# Patient Record
Sex: Female | Born: 1950 | Race: White | Hispanic: No | Marital: Married | Smoking: Current every day smoker
Health system: Southern US, Community
[De-identification: ages and names within clinical notes are randomized; demographics above are authoritative.]

## PROBLEM LIST (undated history)

## (undated) DIAGNOSIS — J449 Chronic obstructive pulmonary disease, unspecified: Secondary | ICD-10-CM

## (undated) DIAGNOSIS — Z720 Tobacco use: Secondary | ICD-10-CM

---

## 1999-07-27 ENCOUNTER — Ambulatory Visit (HOSPITAL_COMMUNITY): Admission: RE | Admit: 1999-07-27 | Discharge: 1999-07-27 | Payer: Self-pay | Admitting: Urology

## 2000-10-18 ENCOUNTER — Encounter: Admission: RE | Admit: 2000-10-18 | Discharge: 2000-11-28 | Payer: Self-pay | Admitting: Obstetrics and Gynecology

## 2002-07-09 ENCOUNTER — Encounter: Admission: RE | Admit: 2002-07-09 | Discharge: 2002-09-06 | Payer: Self-pay | Admitting: Unknown Physician Specialty

## 2002-08-06 ENCOUNTER — Encounter: Admission: RE | Admit: 2002-08-06 | Discharge: 2002-09-04 | Payer: Self-pay | Admitting: Unknown Physician Specialty

## 2016-08-29 ENCOUNTER — Encounter: Payer: Self-pay | Admitting: Internal Medicine

## 2016-09-14 ENCOUNTER — Ambulatory Visit: Payer: Self-pay | Admitting: Nurse Practitioner

## 2017-03-17 ENCOUNTER — Other Ambulatory Visit: Payer: Self-pay | Admitting: *Deleted

## 2018-08-25 ENCOUNTER — Encounter: Payer: Self-pay | Admitting: Emergency Medicine

## 2018-08-25 ENCOUNTER — Inpatient Hospital Stay (HOSPITAL_COMMUNITY)
Admission: EM | Admit: 2018-08-25 | Discharge: 2018-08-28 | DRG: 193 | Disposition: A | Discharge status: Home Health | Payer: Medicare Other | Attending: Family Medicine | Admitting: Family Medicine

## 2018-08-25 ENCOUNTER — Emergency Department (HOSPITAL_COMMUNITY): Payer: Medicare Other

## 2018-08-25 ENCOUNTER — Other Ambulatory Visit: Payer: Self-pay

## 2018-08-25 DIAGNOSIS — J101 Influenza due to other identified influenza virus with other respiratory manifestations: Secondary | ICD-10-CM | POA: Diagnosis present

## 2018-08-25 DIAGNOSIS — J189 Pneumonia, unspecified organism: Secondary | ICD-10-CM | POA: Diagnosis not present

## 2018-08-25 DIAGNOSIS — F17210 Nicotine dependence, cigarettes, uncomplicated: Secondary | ICD-10-CM | POA: Diagnosis present

## 2018-08-25 DIAGNOSIS — G9340 Encephalopathy, unspecified: Secondary | ICD-10-CM | POA: Diagnosis present

## 2018-08-25 DIAGNOSIS — G92 Toxic encephalopathy: Secondary | ICD-10-CM | POA: Diagnosis present

## 2018-08-25 DIAGNOSIS — N390 Urinary tract infection, site not specified: Secondary | ICD-10-CM | POA: Diagnosis present

## 2018-08-25 DIAGNOSIS — J449 Chronic obstructive pulmonary disease, unspecified: Secondary | ICD-10-CM | POA: Diagnosis present

## 2018-08-25 DIAGNOSIS — T380X5A Adverse effect of glucocorticoids and synthetic analogues, initial encounter: Secondary | ICD-10-CM | POA: Diagnosis present

## 2018-08-25 DIAGNOSIS — Z7951 Long term (current) use of inhaled steroids: Secondary | ICD-10-CM

## 2018-08-25 DIAGNOSIS — I10 Essential (primary) hypertension: Secondary | ICD-10-CM | POA: Diagnosis present

## 2018-08-25 DIAGNOSIS — F419 Anxiety disorder, unspecified: Secondary | ICD-10-CM | POA: Diagnosis present

## 2018-08-25 DIAGNOSIS — J1000 Influenza due to other identified influenza virus with unspecified type of pneumonia: Principal | ICD-10-CM | POA: Diagnosis present

## 2018-08-25 DIAGNOSIS — T426X5A Adverse effect of other antiepileptic and sedative-hypnotic drugs, initial encounter: Secondary | ICD-10-CM | POA: Diagnosis present

## 2018-08-25 DIAGNOSIS — Z79890 Hormone replacement therapy (postmenopausal): Secondary | ICD-10-CM | POA: Diagnosis not present

## 2018-08-25 DIAGNOSIS — R739 Hyperglycemia, unspecified: Secondary | ICD-10-CM | POA: Diagnosis present

## 2018-08-25 DIAGNOSIS — J9601 Acute respiratory failure with hypoxia: Secondary | ICD-10-CM | POA: Diagnosis present

## 2018-08-25 DIAGNOSIS — J111 Influenza due to unidentified influenza virus with other respiratory manifestations: Secondary | ICD-10-CM

## 2018-08-25 DIAGNOSIS — Z720 Tobacco use: Secondary | ICD-10-CM | POA: Diagnosis present

## 2018-08-25 DIAGNOSIS — J441 Chronic obstructive pulmonary disease with (acute) exacerbation: Secondary | ICD-10-CM | POA: Diagnosis present

## 2018-08-25 DIAGNOSIS — J440 Chronic obstructive pulmonary disease with acute lower respiratory infection: Secondary | ICD-10-CM | POA: Diagnosis present

## 2018-08-25 DIAGNOSIS — E039 Hypothyroidism, unspecified: Secondary | ICD-10-CM | POA: Diagnosis present

## 2018-08-25 DIAGNOSIS — J1081 Influenza due to other identified influenza virus with encephalopathy: Secondary | ICD-10-CM | POA: Diagnosis present

## 2018-08-25 DIAGNOSIS — A419 Sepsis, unspecified organism: Secondary | ICD-10-CM

## 2018-08-25 DIAGNOSIS — R6520 Severe sepsis without septic shock: Secondary | ICD-10-CM

## 2018-08-25 HISTORY — DX: Tobacco use: Z72.0

## 2018-08-25 HISTORY — DX: Chronic obstructive pulmonary disease, unspecified: J44.9

## 2018-08-25 LAB — CBC WITH DIFFERENTIAL/PLATELET
Abs Immature Granulocytes: 0.13 10*3/uL — ABNORMAL HIGH (ref 0.00–0.07)
Basophils Absolute: 0 10*3/uL (ref 0.0–0.1)
Basophils Relative: 0 %
Eosinophils Absolute: 0.1 10*3/uL (ref 0.0–0.5)
Eosinophils Relative: 1 %
HCT: 38.4 % (ref 36.0–46.0)
Hemoglobin: 12 g/dL (ref 12.0–15.0)
Immature Granulocytes: 2 %
Lymphocytes Relative: 10 %
Lymphs Abs: 0.8 10*3/uL (ref 0.7–4.0)
MCH: 33.5 pg (ref 26.0–34.0)
MCHC: 31.3 g/dL (ref 30.0–36.0)
MCV: 107.3 fL — ABNORMAL HIGH (ref 80.0–100.0)
Monocytes Absolute: 0.6 10*3/uL (ref 0.1–1.0)
Monocytes Relative: 7 %
Neutro Abs: 6.6 10*3/uL (ref 1.7–7.7)
Neutrophils Relative %: 80 %
Platelets: 210 10*3/uL (ref 150–400)
RBC: 3.58 MIL/uL — ABNORMAL LOW (ref 3.87–5.11)
RDW: 16.1 % — ABNORMAL HIGH (ref 11.5–15.5)
WBC: 8.2 10*3/uL (ref 4.0–10.5)
nRBC: 0 % (ref 0.0–0.2)

## 2018-08-25 LAB — URINALYSIS, ROUTINE W REFLEX MICROSCOPIC
Bilirubin Urine: NEGATIVE
Glucose, UA: NEGATIVE mg/dL
Ketones, ur: NEGATIVE mg/dL
Leukocytes,Ua: NEGATIVE
Nitrite: POSITIVE — AB
Protein, ur: 100 mg/dL — AB
Specific Gravity, Urine: 1.019 (ref 1.005–1.030)
pH: 5 (ref 5.0–8.0)

## 2018-08-25 LAB — COMPREHENSIVE METABOLIC PANEL
ALT: 147 U/L — ABNORMAL HIGH (ref 0–44)
AST: 65 U/L — ABNORMAL HIGH (ref 15–41)
Albumin: 4.1 g/dL (ref 3.5–5.0)
Alkaline Phosphatase: 109 U/L (ref 38–126)
Anion gap: 11 (ref 5–15)
BUN: 8 mg/dL (ref 8–23)
CO2: 27 mmol/L (ref 22–32)
Calcium: 8.6 mg/dL — ABNORMAL LOW (ref 8.9–10.3)
Chloride: 100 mmol/L (ref 98–111)
Creatinine, Ser: 0.72 mg/dL (ref 0.44–1.00)
GFR calc Af Amer: >60 mL/min (ref >60)
GFR calc non Af Amer: >60 mL/min (ref >60)
Glucose, Bld: 148 mg/dL — ABNORMAL HIGH (ref 70–99)
Potassium: 3.5 mmol/L (ref 3.5–5.1)
Sodium: 138 mmol/L (ref 135–145)
Total Bilirubin: 0.4 mg/dL (ref 0.3–1.2)
Total Protein: 8 g/dL (ref 6.5–8.1)

## 2018-08-25 LAB — INFLUENZA PANEL BY PCR (TYPE A & B)
Influenza A By PCR: POSITIVE — AB
Influenza B By PCR: NEGATIVE

## 2018-08-25 LAB — LACTIC ACID, PLASMA
Lactic Acid, Venous: 2.1 mmol/L (ref 0.5–1.9)
Lactic Acid, Venous: 1.9 mmol/L (ref 0.5–1.9)

## 2018-08-25 MED ORDER — LEVOTHYROXINE SODIUM 88 MCG PO TABS
88.0000 ug | ORAL_TABLET | Freq: Every day | ORAL | Status: DC
  Administered 2018-08-26 – 2018-08-28 (×3): 88 ug via ORAL
  Filled 2018-08-25 (×6): qty 1

## 2018-08-25 MED ORDER — VANCOMYCIN HCL IN DEXTROSE 1-5 GM/200ML-% IV SOLN
1000.0000 mg | Freq: Once | INTRAVENOUS | Status: AC
  Administered 2018-08-25: 1000 mg via INTRAVENOUS
  Filled 2018-08-25: qty 200

## 2018-08-25 MED ORDER — SODIUM CHLORIDE 0.9 % IV SOLN
2.0000 g | Freq: Once | INTRAVENOUS | Status: AC
  Administered 2018-08-25: 2 g via INTRAVENOUS
  Filled 2018-08-25: qty 2

## 2018-08-25 MED ORDER — OSELTAMIVIR PHOSPHATE 30 MG PO CAPS
30.0000 mg | ORAL_CAPSULE | Freq: Two times a day (BID) | ORAL | Status: DC
  Administered 2018-08-25 – 2018-08-28 (×6): 30 mg via ORAL
  Filled 2018-08-25 (×6): qty 1

## 2018-08-25 MED ORDER — IPRATROPIUM-ALBUTEROL 0.5-2.5 (3) MG/3ML IN SOLN
3.0000 mL | Freq: Once | RESPIRATORY_TRACT | Status: AC
  Administered 2018-08-25: 3 mL via RESPIRATORY_TRACT
  Filled 2018-08-25: qty 3

## 2018-08-25 MED ORDER — SODIUM CHLORIDE 0.9 % IV SOLN
500.0000 mg | INTRAVENOUS | Status: DC
  Administered 2018-08-25 – 2018-08-27 (×3): 500 mg via INTRAVENOUS
  Filled 2018-08-25 (×3): qty 500

## 2018-08-25 MED ORDER — OSELTAMIVIR PHOSPHATE 75 MG PO CAPS
75.0000 mg | ORAL_CAPSULE | Freq: Once | ORAL | Status: AC
  Administered 2018-08-25: 75 mg via ORAL
  Filled 2018-08-25: qty 1

## 2018-08-25 MED ORDER — TRAMADOL HCL 50 MG PO TABS
50.0000 mg | ORAL_TABLET | Freq: Two times a day (BID) | ORAL | Status: DC | PRN
  Administered 2018-08-26 – 2018-08-27 (×3): 50 mg via ORAL
  Filled 2018-08-25 (×3): qty 1

## 2018-08-25 MED ORDER — BUDESONIDE 0.25 MG/2ML IN SUSP
0.2500 mg | Freq: Two times a day (BID) | RESPIRATORY_TRACT | Status: DC
  Administered 2018-08-25 – 2018-08-28 (×6): 0.25 mg via RESPIRATORY_TRACT
  Filled 2018-08-25 (×6): qty 2

## 2018-08-25 MED ORDER — ACETAMINOPHEN 325 MG PO TABS
650.0000 mg | ORAL_TABLET | Freq: Once | ORAL | Status: AC
Start: 2018-08-25 — End: 2018-08-25
  Administered 2018-08-25: 650 mg via ORAL
  Filled 2018-08-25: qty 2

## 2018-08-25 MED ORDER — SODIUM CHLORIDE 0.9 % IV SOLN
INTRAVENOUS | Status: DC
  Administered 2018-08-25 – 2018-08-27 (×3): via INTRAVENOUS

## 2018-08-25 MED ORDER — GUAIFENESIN ER 600 MG PO TB12
1200.0000 mg | ORAL_TABLET | Freq: Two times a day (BID) | ORAL | Status: DC
  Administered 2018-08-25 – 2018-08-28 (×7): 1200 mg via ORAL
  Filled 2018-08-25 (×7): qty 2

## 2018-08-25 MED ORDER — SODIUM CHLORIDE 0.9 % IV SOLN
1.0000 g | INTRAVENOUS | Status: DC
  Administered 2018-08-25 – 2018-08-27 (×3): 1 g via INTRAVENOUS
  Filled 2018-08-25 (×3): qty 10

## 2018-08-25 MED ORDER — SODIUM CHLORIDE 0.9 % IV BOLUS
1000.0000 mL | Freq: Once | INTRAVENOUS | Status: AC
  Administered 2018-08-25: 1000 mL via INTRAVENOUS

## 2018-08-25 MED ORDER — BUSPIRONE HCL 5 MG PO TABS
15.0000 mg | ORAL_TABLET | Freq: Three times a day (TID) | ORAL | Status: DC
  Administered 2018-08-25 – 2018-08-28 (×9): 15 mg via ORAL
  Filled 2018-08-25 (×9): qty 3

## 2018-08-25 MED ORDER — ENOXAPARIN SODIUM 40 MG/0.4ML ~~LOC~~ SOLN
40.0000 mg | SUBCUTANEOUS | Status: DC
  Administered 2018-08-25 – 2018-08-27 (×3): 40 mg via SUBCUTANEOUS
  Filled 2018-08-25 (×3): qty 0.4

## 2018-08-25 MED ORDER — IPRATROPIUM-ALBUTEROL 0.5-2.5 (3) MG/3ML IN SOLN
3.0000 mL | Freq: Four times a day (QID) | RESPIRATORY_TRACT | Status: DC
  Administered 2018-08-25 – 2018-08-26 (×3): 3 mL via RESPIRATORY_TRACT
  Filled 2018-08-25 (×4): qty 3

## 2018-08-25 MED ORDER — GABAPENTIN 300 MG PO CAPS
600.0000 mg | ORAL_CAPSULE | Freq: Three times a day (TID) | ORAL | Status: DC
  Administered 2018-08-25 – 2018-08-26 (×3): 600 mg via ORAL
  Filled 2018-08-25 (×3): qty 2

## 2018-08-25 MED ORDER — ALBUTEROL SULFATE (2.5 MG/3ML) 0.083% IN NEBU: 2.5000 mg | INHALATION_SOLUTION | Freq: Four times a day (QID) | RESPIRATORY_TRACT | Status: DC | PRN

## 2018-08-25 MED ORDER — OSELTAMIVIR PHOSPHATE 75 MG PO CAPS: 75.0000 mg | ORAL_CAPSULE | Freq: Two times a day (BID) | ORAL | Status: DC

## 2018-08-25 NOTE — ED Provider Notes (Signed)
[facility] EMERGENCY DEPARTMENT Provider Note   CSN: [id number] Arrival date & time: 08/25/18  0272    History   Chief Complaint Chief Complaint  Patient presents with  . Fever    HPI Melissa Odom is a 68 y.o. female.     HPI   67yF with fever and confusion. History primarily from husband. Onset of symptoms about a week ago. Coughing a lot. Tired and sleeping a lot. Confused at times. Poor appetite. He has been trying to get her to see the doctor for several days until she finally agreed to this morning. Last night she was incontinent of urine. She is a smoker but husband isn't aware of formal diagnosis of underlying lung disease. She is not on supplemental oxygen at baseline.   History reviewed. No pertinent past medical history.  There are no active problems to display for this patient.   History reviewed. No pertinent surgical history.   OB History   No obstetric history on file.      Home Medications    Prior to Admission medications   Not on File    Family History History reviewed. No pertinent family history.  Social History Social History   Tobacco Use  . Smoking status: Current Every Day Smoker    Packs/day: 1.00    Types: Cigarettes  . Smokeless tobacco: Never Used  Substance Use Topics  . Alcohol use: Never    Frequency: Never  . Drug use: Never    Comment: previous opiate addiction     Allergies   Patient has no known allergies.   Review of Systems Review of Systems Level 5 caveat because of confusion.   Physical Exam Updated Vital Signs BP 116/66   Pulse (!) 110   Temp (!) 103.1 F (39.5 C) (Oral)   Resp (!) 33   Ht  (1.499 m)   Wt 52.2 kg   SpO2 95%   BMI 23.23 kg/m   Physical Exam Vitals signs and nursing note reviewed.  Constitutional:      Appearance: She is well-developed. She is ill-appearing.     Comments: Drowsy. Opens eyes to voice. Answering questions but clearly confused. Disoriented to place and  time. Follows simple commands. No focal motor deficits.   HENT:     Head: Normocephalic and atraumatic.  Eyes:     General:        Right eye: No discharge.        Left eye: No discharge.     Conjunctiva/sclera: Conjunctivae normal.  Neck:     Musculoskeletal: Neck supple.  Cardiovascular:     Rate and Rhythm: Normal rate and regular rhythm.     Heart sounds: Normal heart sounds. No murmur. No friction rub. No gallop.   Pulmonary:     Effort: Pulmonary effort is normal. No respiratory distress.     Breath sounds: Normal breath sounds.  Abdominal:     General: There is no distension.     Palpations: Abdomen is soft.     Tenderness: There is no abdominal tenderness.  Musculoskeletal:        General: No tenderness.  Skin:    General: Skin is warm and dry.  Psychiatric:        Behavior: Behavior normal.        Thought Content: Thought content normal.      ED Treatments / Results  Labs (all labs ordered are listed, but only abnormal results are displayed) Labs Reviewed  CBC  WITH DIFFERENTIAL/PLATELET - Abnormal; Notable for the following components:      Result Value   RBC 3.58 (*)    MCV 107.3 (*)    RDW 16.1 (*)    Abs Immature Granulocytes 0.13 (*)    All other components within normal limits  COMPREHENSIVE METABOLIC PANEL - Abnormal; Notable for the following components:   Glucose, Bld 148 (*)    Calcium 8.6 (*)    AST 65 (*)    ALT 147 (*)    All other components within normal limits  INFLUENZA PANEL BY PCR (TYPE A & B) - Abnormal; Notable for the following components:   Influenza A By PCR POSITIVE (*)    All other components within normal limits  URINALYSIS, ROUTINE W REFLEX MICROSCOPIC - Abnormal; Notable for the following components:   Color, Urine AMBER (*)    APPearance HAZY (*)    Hgb urine dipstick SMALL (*)    Protein, ur 100 (*)    Nitrite POSITIVE (*)    Bacteria, UA MANY (*)    All other components within normal limits  LACTIC ACID, PLASMA -  Abnormal; Notable for the following components:   Lactic Acid, Venous 2.1 (*)    All other components within normal limits  CULTURE, BLOOD (ROUTINE X 2)  CULTURE, BLOOD (ROUTINE X 2)  LACTIC ACID, PLASMA    EKG None  Radiology Dg Chest 2 View  Result Date: 08/25/2018 CLINICAL DATA:  Fever and cough EXAM: CHEST - 2 VIEW COMPARISON:  None. FINDINGS: Cardiac shadows within normal limits. Aortic calcifications are again noted. Lungs are well aerated bilaterally with patchy bronchitic changes. More focal infiltrate is noted in the left upper lobe. No sizable effusion is noted. IMPRESSION: Increased bronchitic changes with more focal infiltrate in the left apex. Followup PA and lateral chest X-ray is recommended in 3-4 weeks following trial of antibiotic therapy to ensure resolution and exclude underlying malignancy. Electronically Signed   By: Alcide Clever M.D.   On: 08/25/2018 09:07    Procedures Procedures (including critical care time)  CRITICAL CARE Performed by: Raeford Razor Total critical care time: 35 minutes Critical care time was exclusive of separately billable procedures and treating other patients. Critical care was necessary to treat or prevent imminent or life-threatening deterioration. Critical care was time spent personally by me on the following activities: development of treatment plan with patient and/or surrogate as well as nursing, discussions with consultants, evaluation of patient's response to treatment, examination of patient, obtaining history from patient or surrogate, ordering and performing treatments and interventions, ordering and review of laboratory studies, ordering and review of radiographic studies, pulse oximetry and re-evaluation of patient's condition.   Medications Ordered in ED Medications  vancomycin (VANCOCIN) IVPB 1000 mg/200 mL premix (has no administration in time range)  oseltamivir (TAMIFLU) capsule 75 mg (has no administration in time range)   sodium chloride 0.9 % bolus 1,000 mL (has no administration in time range)  ceFEPIme (MAXIPIME) 2 g in sodium chloride 0.9 % 100 mL IVPB (2 g Intravenous New Bag/Given 08/25/18 0824)  acetaminophen (TYLENOL) tablet 650 mg (650 mg Oral Given 08/25/18 0817)  ipratropium-albuterol (DUONEB) 0.5-2.5 (3) MG/3ML nebulizer solution 3 mL (3 mLs Nebulization Given 08/25/18 0759)     Initial Impression / Assessment and Plan / ED Course  I have reviewed the triage vital signs and the nursing notes.  Pertinent labs & imaging results that were available during my care of the patient were reviewed by me  and considered in my medical decision making (see chart for details).  36yf with fever and confusion. Positive for influenza A, also possible pneumonia on chest x-ray as well as UTI.   Received empiric cefepime/vanc prior to w/u coming back. Tamiflu added. Hypoxic on Ra to 70s but in 90s on 3L via [place]. Encephalopathy likely from infectious processes. Exam not concerning for meningitis. Listed hx of opiate addiction. She is prescribed tramadol and clonazepam. Husband has no recent concerns for misuse.   Final Clinical Impressions(s) / ED Diagnoses   Final diagnoses:  Acute respiratory failure with hypoxia (HCC)  Influenza with respiratory manifestation  Urinary tract infection without hematuria, site unspecified  Sepsis with acute hypoxic respiratory failure, due to unspecified organism, unspecified whether septic shock present [facility])    ED Discharge Orders    None       Raeford Razor, MD 08/29/18 1229

## 2018-08-25 NOTE — ED Notes (Signed)
Date and time results received: 08/25/18 0859 (use smartphrase ".now" to insert current time)  Test: Lactic Acid Critical Value: 2.1  Name of Provider Notified: Dr. Juleen China  Orders Received? Or Actions Taken?: RN & MD made aware.

## 2018-08-25 NOTE — ED Triage Notes (Signed)
Pt brought in by her husband for evaluation of cough, congestion, and altered mental status all week.  Pt does not wear home oxygen.  Is disoriented to place and time.

## 2018-08-25 NOTE — H&P (Signed)
History and Physical    Melissa Odom LMB:[id number] DOB: 1951-06-15 DOA: 08/25/2018  PCP: Kirstie Peri, MD  Patient coming from: Home  I have personally briefly reviewed patient's old medical records in [facility]  Chief Complaint: Cough  HPI: Melissa Odom is a 68 y.o. female with medical history significant of anxiety, tobacco use, hypertension, hypothyroidism, is brought to the hospital with cough.  History is limited since patient is somnolent.  No family is available for additional history.  Per the medical record, patient has been increasingly confused.  She has been febrile.  She has been coughing for the past week.  She has been sleeping more.  She is very poor appetite.  She has been increasingly weak.  ED Course: On arrival to the emergency room, she was noted to be hypoxic.  She also febrile with a temperature of 103.  She is tachycardic.  Blood pressure stable.  She is given Tylenol which improved her fever.  Influenza PCR is positive.  Chest x-ray indicates developing pneumonia.  Urinalysis indicates possible infection.  She was started on intravenous antibiotics.  She is receiving IV fluids.  She has been referred for admission.  Review of Systems: Limited due to patient's mental status Constitutional: Positive for fever and malaise and generalized weakness Respiratory: Positive for cough Neuro: Positive for lethargy and confusion, negative for unilateral weakness Skin: Negative for rash All other systems reviewed and are negative   History reviewed. No pertinent past medical history.  History reviewed. No pertinent surgical history.  Social History:  reports that she has been smoking cigarettes. She has been smoking about 1.00 pack per day. She has never used smokeless tobacco. She reports that she does not drink alcohol or use drugs.  Allergies  Allergen Reactions  . Morphine Anaphylaxis  . Sulfamethoxazole Rash    Unknown    Family history: Unable to  assess due to mental status  Prior to Admission medications   Medication Sig Start Date End Date Taking? Authorizing Provider  ALPRAZolam Prudy Feeler) 0.5 MG tablet Take 0.5 mg by mouth 3 (three) times daily as needed.    Yes [provider]  atenolol (TENORMIN) 50 MG tablet Take 50 mg by mouth daily.    Yes [provider]  BREO ELLIPTA 100-25 MCG/INH AEPB Inhale 1 puff into the lungs daily.  04/20/18  Yes [provider]  busPIRone (BUSPAR) 15 MG tablet Take 15 mg by mouth 3 (three) times daily. 08/02/18  Yes [provider]  clonazePAM (KLONOPIN) 1 MG tablet Take 1 mg by mouth 3 (three) times daily as needed for anxiety.    Yes [provider]  gabapentin (NEURONTIN) 600 MG tablet Take 600 mg by mouth 3 (three) times daily.  08/02/18  Yes [provider]  levothyroxine (SYNTHROID, LEVOTHROID) 88 MCG tablet Take 88 mcg by mouth daily. 08/02/18   [provider]  traMADol (ULTRAM) 50 MG tablet Take 50 mg by mouth 2 (two) times daily. 08/13/18   [provider]  Vitamin D, Ergocalciferol, (DRISDOL) 1.25 MG (50000 UT) CAPS capsule Take 50,000 Units by mouth.  08/02/18   [provider]    Physical Exam: Vitals:   08/25/18 0930 08/25/18 0949 08/25/18 1000 08/25/18 1030  BP: 108/64  101/63 101/63  Pulse:   99 93  Resp: (!) 30  (!) 29 (!) 23  Temp:  99.7 F (37.6 C)    TempSrc:  Oral    SpO2:   97% 93%  Weight:      Height:        Constitutional: NAD, calm, comfortable Eyes: PERRL, lids and conjunctivae normal ENMT: Mucous membranes are dry. Posterior pharynx clear of any exudate or lesions.Normal dentition.  Neck: normal, supple, no masses, no thyromegaly Respiratory: Bilateral rhonchi with wheezing. Normal respiratory effort. No accessory muscle use.  Cardiovascular: Regular rate and rhythm, no murmurs / rubs / gallops. No extremity edema. 2+ pedal pulses. No carotid bruits.  Abdomen: no tenderness, no masses  palpated. No hepatosplenomegaly. Bowel sounds positive.  Musculoskeletal: no clubbing / cyanosis. No joint deformity upper and lower extremities. Good ROM, no contractures. Normal muscle tone.  Skin: no rashes, lesions, ulcers. No induration Neurologic: CN 2-12 grossly intact. Sensation intact, DTR normal. Strength 5/5 in all 4.  Psychiatric: Somnolent, wakes up to voice, knows she is in the hospital, pleasant   Labs on Admission: I have personally reviewed following labs and imaging studies  CBC: Recent Labs  Lab 08/25/18 0822  WBC 8.2  NEUTROABS 6.6  HGB 12.0  HCT 38.4  MCV 107.3*  PLT 210   Basic Metabolic Panel: Recent Labs  Lab 08/25/18 0822  NA 138  K 3.5  CL 100  CO2 27  GLUCOSE 148*  BUN 8  CREATININE 0.72  CALCIUM 8.6*   GFR: Estimated Creatinine Clearance: 50.4 mL/min (by C-G formula based on SCr of 0.72 mg/dL). Liver Function Tests: Recent Labs  Lab 08/25/18 0822  AST 65*  ALT 147*  ALKPHOS 109  BILITOT 0.4  PROT 8.0  ALBUMIN 4.1   No results for input(s): LIPASE, AMYLASE in the last 168 hours. No results for input(s): AMMONIA in the last 168 hours. Coagulation Profile: No results for input(s): INR, PROTIME in the last 168 hours. Cardiac Enzymes: No results for input(s): CKTOTAL, CKMB, CKMBINDEX, TROPONINI in the last 168 hours. BNP (last 3 results) No results for input(s): PROBNP in the last 8760 hours. HbA1C: No results for input(s): HGBA1C in the last 72 hours. CBG: No results for input(s): GLUCAP in the last 168 hours. Lipid Profile: No results for input(s): CHOL, HDL, LDLCALC, TRIG, CHOLHDL, LDLDIRECT in the last 72 hours. Thyroid Function Tests: No results for input(s): TSH, T4TOTAL, FREET4, T3FREE, THYROIDAB in the last 72 hours. Anemia Panel: No results for input(s): VITAMINB12, FOLATE, FERRITIN, TIBC, IRON, RETICCTPCT in the last 72 hours. Urine analysis:    Component Value Date/Time   COLORURINE AMBER (A) 08/25/2018 0749    APPEARANCEUR HAZY (A) 08/25/2018 0749   LABSPEC 1.019 08/25/2018 0749   PHURINE 5.0 08/25/2018 0749   GLUCOSEU NEGATIVE 08/25/2018 0749   HGBUR SMALL (A) 08/25/2018 0749   BILIRUBINUR NEGATIVE 08/25/2018 0749   KETONESUR NEGATIVE 08/25/2018 0749   PROTEINUR 100 (A) 08/25/2018 0749   NITRITE POSITIVE (A) 08/25/2018 0749   LEUKOCYTESUR NEGATIVE 08/25/2018 0749    Radiological Exams on Admission: Dg Chest 2 View  Result Date: 08/25/2018 CLINICAL DATA:  Fever and cough EXAM: CHEST - 2 VIEW COMPARISON:  None. FINDINGS: Cardiac shadows within normal limits. Aortic calcifications are again noted. Lungs are well aerated bilaterally with patchy bronchitic changes. More focal infiltrate is noted in the left upper lobe. No sizable effusion is noted. IMPRESSION: Increased bronchitic changes with more focal infiltrate in the left apex. Followup PA and lateral chest X-ray is recommended in 3-4 weeks following trial of antibiotic therapy to ensure resolution and exclude underlying malignancy. Electronically Signed   By: Alcide CleverMark  Lukens M.D.   On: 08/25/2018 09:07  Assessment/Plan Active Problems:   CAP (community acquired pneumonia)   Acute respiratory failure with hypoxia (HCC)   Influenza A   HTN (hypertension)   Acute lower UTI   Hypothyroidism   Anxiety   Acute encephalopathy     1. Community-acquired pneumonia.  Start the patient on intravenous antibiotics with ceftriaxone and azithromycin.  Continue pulmonary hygiene.  Check urinary antigens. 2. Influenza A.  Continue on Tamiflu 3. Acute respiratory failure with hypoxia.  Related to pneumonia.  We will try and wean off oxygen as tolerated.  The patient does smoke, but does not carry a formal diagnosis of COPD.  Review of her home medications indicate that she is on a inhaled steroids and long-acting bronchodilator.  Will continue on nebulizers and inhaled steroids while in the hospital. 4. Possible UTI.  Urinalysis indicates possible  infection.  Follow-up on cultures.  She is already on antibiotics. 5. Acute encephalopathy.  Likely multifactorial related to acute infectious process as well as her medications.  She is on several sedative medications.  Continue to monitor. 6. Anxiety.  Hold Xanax, Klonopin and BuSpar until she is more awake. 7. Hypertension.  Will hold antihypertensives for now since blood pressures are borderline.  DVT prophylaxis: Lovenox Code Status: Full code Family Communication: No family present, unable to reach husband over the phone Disposition Plan: Discharge home once improved Consults called:   Admission status: Inpatient, telemetry  Erick Blinks MD [facility]   If 7PM-7AM, please contact night-coverage [email or web address]   08/25/2018, 11:05 AM

## 2018-08-25 NOTE — Progress Notes (Signed)
Pharmacy Renal Dosing Adjustment  Drug: olseltamivir Original dose: 75mg  bid x10 doses  CrClest~ 50 ml/min New dose:  30mg  bid x 10 doses  Thank You, Tama High, Pharm. D. Clinical Pharmacist 08/25/2018 2:05 PM

## 2018-08-25 NOTE — ED Notes (Signed)
Unable to obtain accurate history as husband is poor historian and pt is confused.  No records available for review. Pt's pcp is Dr. Sherryll Burger in [place].

## 2018-08-25 NOTE — Plan of Care (Signed)

## 2018-08-26 ENCOUNTER — Encounter (HOSPITAL_COMMUNITY): Payer: Self-pay | Admitting: Family Medicine

## 2018-08-26 DIAGNOSIS — J449 Chronic obstructive pulmonary disease, unspecified: Secondary | ICD-10-CM | POA: Diagnosis present

## 2018-08-26 DIAGNOSIS — Z720 Tobacco use: Secondary | ICD-10-CM

## 2018-08-26 DIAGNOSIS — J441 Chronic obstructive pulmonary disease with (acute) exacerbation: Secondary | ICD-10-CM

## 2018-08-26 LAB — BASIC METABOLIC PANEL
Anion gap: 8 (ref 5–15)
BUN: 7 mg/dL — ABNORMAL LOW (ref 8–23)
CO2: 28 mmol/L (ref 22–32)
Calcium: 8.3 mg/dL — ABNORMAL LOW (ref 8.9–10.3)
Chloride: 106 mmol/L (ref 98–111)
Creatinine, Ser: 0.66 mg/dL (ref 0.44–1.00)
GFR calc Af Amer: >60 mL/min (ref >60)
GFR calc non Af Amer: >60 mL/min (ref >60)
Glucose, Bld: 117 mg/dL — ABNORMAL HIGH (ref 70–99)
Potassium: 3.5 mmol/L (ref 3.5–5.1)
Sodium: 142 mmol/L (ref 135–145)

## 2018-08-26 LAB — CBC
HCT: 34.1 % — ABNORMAL LOW (ref 36.0–46.0)
Hemoglobin: 10.4 g/dL — ABNORMAL LOW (ref 12.0–15.0)
MCH: 33.8 pg (ref 26.0–34.0)
MCHC: 30.5 g/dL (ref 30.0–36.0)
MCV: 110.7 fL — AB (ref 80.0–100.0)
Platelets: 193 10*3/uL (ref 150–400)
RBC: 3.08 MIL/uL — ABNORMAL LOW (ref 3.87–5.11)
RDW: 16.8 % — ABNORMAL HIGH (ref 11.5–15.5)
WBC: 11 10*3/uL — ABNORMAL HIGH (ref 4.0–10.5)
nRBC: 0 % (ref 0.0–0.2)

## 2018-08-26 LAB — HIV ANTIBODY (ROUTINE TESTING W REFLEX): HIV SCREEN 4TH GENERATION: NONREACTIVE

## 2018-08-26 LAB — GLUCOSE, CAPILLARY: Glucose-Capillary: 337 mg/dL — ABNORMAL HIGH (ref 70–99)

## 2018-08-26 MED ORDER — CLONAZEPAM 0.5 MG PO TABS: 0.5000 mg | ORAL_TABLET | Freq: Three times a day (TID) | ORAL | Status: DC | PRN

## 2018-08-26 MED ORDER — METHYLPREDNISOLONE SODIUM SUCC 40 MG IJ SOLR
40.0000 mg | Freq: Four times a day (QID) | INTRAMUSCULAR | Status: DC
  Administered 2018-08-26 – 2018-08-27 (×5): 40 mg via INTRAVENOUS
  Filled 2018-08-26 (×5): qty 1

## 2018-08-26 MED ORDER — GABAPENTIN 400 MG PO CAPS
400.0000 mg | ORAL_CAPSULE | Freq: Three times a day (TID) | ORAL | Status: DC
  Administered 2018-08-26 – 2018-08-28 (×6): 400 mg via ORAL
  Filled 2018-08-26 (×6): qty 1

## 2018-08-26 MED ORDER — SERTRALINE HCL 50 MG PO TABS
50.0000 mg | ORAL_TABLET | Freq: Every day | ORAL | Status: DC
  Administered 2018-08-26 – 2018-08-28 (×3): 50 mg via ORAL
  Filled 2018-08-26 (×3): qty 1

## 2018-08-26 MED ORDER — SERTRALINE HCL 50 MG PO TABS: 100.0000 mg | ORAL_TABLET | Freq: Every day | ORAL | Status: DC

## 2018-08-26 MED ORDER — ACETAMINOPHEN 325 MG PO TABS
650.0000 mg | ORAL_TABLET | Freq: Four times a day (QID) | ORAL | Status: DC
  Administered 2018-08-26 – 2018-08-27 (×4): 650 mg via ORAL
  Filled 2018-08-26 (×4): qty 2

## 2018-08-26 MED ORDER — IPRATROPIUM-ALBUTEROL 0.5-2.5 (3) MG/3ML IN SOLN
3.0000 mL | RESPIRATORY_TRACT | Status: DC
  Administered 2018-08-26 – 2018-08-27 (×8): 3 mL via RESPIRATORY_TRACT
  Filled 2018-08-26 (×7): qty 3

## 2018-08-26 NOTE — Progress Notes (Signed)
PROGRESS NOTE  Melissa Odom  CVU:[id number]  DOB: 1951/04/13  DOA: 08/25/2018 PCP: Kirstie Peri, MD   Brief Admission Hx: 68 y.o. female with medical history significant of anxiety, tobacco use, hypertension, hypothyroidism, is brought to the hospital with cough, influenza A and pneumonia.   MDM/Assessment & Plan:   1. Community-acquired pneumonia - Continue intravenous antibiotics with ceftriaxone and azithromycin.  Continue pulmonary hygiene.  Following urinary antigens. 2. Influenza A -  Continue Tamiflu dosed by pharmacy.  3. Acute respiratory failure with hypoxia.  Secondary to pneumonia.  Wean off oxygen as tolerated.   4. COPD exacerbation - added steroids, continue nebs and antibiotics.  5. UTI.  Urinalysis indicates possible infection.  Follow-up on cultures.  She is already on antibiotics. 6. Acute encephalopathy.  Improving.  Likely multifactorial related to acute infectious process as well as her medications.  She is on several sedative medications.  Continue to monitor. 7. Anxiety - clonazepam ordered as needed.  Resumed zoloft and buspar.  8. Hypertension.  Temporarily hold antihypertensives for now since blood pressures are soft.  DVT prophylaxis: Lovenox Code Status: Full code Family Communication: No family present, unable to reach husband over the phone Disposition Plan: Discharge home once improved Consults called:   Admission status: Inpatient, telemetry  Procedures:    Antimicrobials:  oseltamivir 2/29 >  azithromycin 2/29 >  Ceftriaxone 2/29 >  Subjective: Pt reports wheezing and coughing.    Objective: Vitals:   08/26/18 0124 08/26/18 0300 08/26/18 0529 08/26/18 1300  BP:   104/63 110/69  Pulse:   (!) 105 98  Resp:   (!) 26 19  Temp:   98.7 F (37.1 C) 98.6 F (37 C)  TempSrc:   Oral Oral  SpO2: (!) 89% 96% 100% 98%  Weight:      Height:        Intake/Output Summary (Last 24 hours) at 08/26/2018 1545 Last data filed at 08/26/2018  1509 Gross per 24 hour  Intake 2435.52 ml  Output 700 ml  Net 1735.52 ml   Filed Weights   08/25/18 0730  Weight: 52.2 kg   REVIEW OF SYSTEMS  As per history otherwise all reviewed and reported negative  Exam:  General exam: awake, alert, NAD, oriented x 2.  Respiratory system: diffuse insp/exp wheezing. Mild increased work of breathing. Cardiovascular system: S1 & S2 heard. No JVD, murmurs, gallops, clicks or pedal edema. Gastrointestinal system: Abdomen is nondistended, soft and nontender. Normal bowel sounds heard. Central nervous system: Alert and oriented. No focal neurological deficits. Extremities: no cyanosis or clubbing.  Data Reviewed: Basic Metabolic Panel: Recent Labs  Lab 08/25/18 0822 08/26/18 0622  NA 138 142  K 3.5 3.5  CL 100 106  CO2 27 28  GLUCOSE 148* 117*  BUN 8 7*  CREATININE 0.72 0.66  CALCIUM 8.6* 8.3*   Liver Function Tests: Recent Labs  Lab 08/25/18 0822  AST 65*  ALT 147*  ALKPHOS 109  BILITOT 0.4  PROT 8.0  ALBUMIN 4.1   No results for input(s): LIPASE, AMYLASE in the last 168 hours. No results for input(s): AMMONIA in the last 168 hours. CBC: Recent Labs  Lab 08/25/18 0822 08/26/18 0622  WBC 8.2 11.0*  NEUTROABS 6.6  --   HGB 12.0 10.4*  HCT 38.4 34.1*  MCV 107.3* 110.7*  PLT 210 193   Cardiac Enzymes: No results for input(s): CKTOTAL, CKMB, CKMBINDEX, TROPONINI in the last 168 hours. CBG (last 3)  No results for input(s): GLUCAP in  the last 72 hours. Recent Results (from the past 240 hour(s))  Blood culture (routine x 2)     Status: None (Preliminary result)   Collection Time: 08/25/18  8:23 AM  Result Value Ref Range Status   Specimen Description   Final    BLOOD RIGHT ARM BOTTLES DRAWN AEROBIC AND ANAEROBIC   Special Requests   Final    Blood Culture results may not be optimal due to an inadequate volume of blood received in culture bottles   Culture   Final    NO GROWTH 1 DAY Performed at [facility], [address], [place], [place] [address]    Report Status PENDING  Incomplete  Blood culture (routine x 2)     Status: None (Preliminary result)   Collection Time: 08/25/18  8:24 AM  Result Value Ref Range Status   Specimen Description BLOOD LEFT ARM BOTTLES DRAWN AEROBIC AND ANAEROBIC  Final   Special Requests Blood Culture adequate volume  Final   Culture   Final    NO GROWTH 1 DAY Performed at [facility], [address], [place], [place] [address]    Report Status PENDING  Incomplete     Studies: Dg Chest 2 View  Result Date: 08/25/2018 CLINICAL DATA:  Fever and cough EXAM: CHEST - 2 VIEW COMPARISON:  None. FINDINGS: Cardiac shadows within normal limits. Aortic calcifications are again noted. Lungs are well aerated bilaterally with patchy bronchitic changes. More focal infiltrate is noted in the left upper lobe. No sizable effusion is noted. IMPRESSION: Increased bronchitic changes with more focal infiltrate in the left apex. Followup PA and lateral chest X-ray is recommended in 3-4 weeks following trial of antibiotic therapy to ensure resolution and exclude underlying malignancy. Electronically Signed   By: Alcide Clever M.D.   On: 08/25/2018 09:07   Scheduled Meds: . acetaminophen  650 mg Oral Q6H  . budesonide (PULMICORT) nebulizer solution  0.25 mg Nebulization BID  . busPIRone  15 mg Oral TID  . enoxaparin (LOVENOX) injection  40 mg Subcutaneous Q24H  . gabapentin  600 mg Oral TID  . guaiFENesin  1,200 mg Oral BID  . ipratropium-albuterol  3 mL Nebulization Q4H  . levothyroxine  88 mcg Oral Daily  . methylPREDNISolone (SOLU-MEDROL) injection  40 mg Intravenous Q6H  . oseltamivir  30 mg Oral BID   Continuous Infusions: . sodium chloride 75 mL/hr at 08/26/18 1241  . azithromycin 500 mg (08/26/18 1452)  . cefTRIAXone (ROCEPHIN)  IV 1 g (08/26/18 1352)    Active Problems:   CAP (community acquired pneumonia)   Acute respiratory failure with hypoxia (HCC)   Influenza  A   HTN (hypertension)   Acute lower UTI   Hypothyroidism   Anxiety   Acute encephalopathy  Time spent:   Standley Dakins, MD [facility] 08/26/2018, 3:45 PM    LOS: 1 day  How to contact the [facility] Attending or Consulting provider 7A - 7P or covering provider during after hours 7P -7A, for this patient?  1. Check the care team in [facility] and look for a) attending/consulting [facility] provider listed and b) the [facility] team listed 2. Log into [email or web address] and use [facility] universal password to access. If you do not have the password, please contact the hospital operator. 3. Locate the [facility] provider you are looking for under [facility] and page to a number that you can be directly reached. 4. If you still have difficulty reaching the provider, please page the [facility] (  Director on Call) for the Hospitalists listed on amion for assistance.  

## 2018-08-27 LAB — COMPREHENSIVE METABOLIC PANEL
ALT: 68 U/L — ABNORMAL HIGH (ref 0–44)
ANION GAP: 8 (ref 5–15)
AST: 24 U/L (ref 15–41)
Albumin: 3 g/dL — ABNORMAL LOW (ref 3.5–5.0)
Alkaline Phosphatase: 67 U/L (ref 38–126)
BUN: 8 mg/dL (ref 8–23)
CO2: 26 mmol/L (ref 22–32)
Calcium: 8.1 mg/dL — ABNORMAL LOW (ref 8.9–10.3)
Chloride: 106 mmol/L (ref 98–111)
Creatinine, Ser: 0.7 mg/dL (ref 0.44–1.00)
GFR calc Af Amer: >60 mL/min (ref >60)
Glucose, Bld: 207 mg/dL — ABNORMAL HIGH (ref 70–99)
Potassium: 2.7 mmol/L — CL (ref 3.5–5.1)
Sodium: 140 mmol/L (ref 135–145)
Total Bilirubin: 0.2 mg/dL — ABNORMAL LOW (ref 0.3–1.2)
Total Protein: 6.5 g/dL (ref 6.5–8.1)

## 2018-08-27 LAB — CBC WITH DIFFERENTIAL/PLATELET
Abs Immature Granulocytes: 0.27 10*3/uL — ABNORMAL HIGH (ref 0.00–0.07)
BASOS PCT: 0 %
Basophils Absolute: 0 10*3/uL (ref 0.0–0.1)
EOS PCT: 1 %
Eosinophils Absolute: 0.1 10*3/uL (ref 0.0–0.5)
HCT: 31.2 % — ABNORMAL LOW (ref 36.0–46.0)
Hemoglobin: 9.4 g/dL — ABNORMAL LOW (ref 12.0–15.0)
Immature Granulocytes: 4 %
Lymphocytes Relative: 12 %
Lymphs Abs: 0.9 10*3/uL (ref 0.7–4.0)
MCH: 33.2 pg (ref 26.0–34.0)
MCHC: 30.1 g/dL (ref 30.0–36.0)
MCV: 110.2 fL — AB (ref 80.0–100.0)
Monocytes Absolute: 0.3 10*3/uL (ref 0.1–1.0)
Monocytes Relative: 3 %
Neutro Abs: 6 10*3/uL (ref 1.7–7.7)
Neutrophils Relative %: 80 %
Platelets: 189 10*3/uL (ref 150–400)
RBC: 2.83 MIL/uL — ABNORMAL LOW (ref 3.87–5.11)
RDW: 16.4 % — ABNORMAL HIGH (ref 11.5–15.5)
WBC Morphology: INCREASED
WBC: 7.6 10*3/uL (ref 4.0–10.5)
nRBC: 0 % (ref 0.0–0.2)

## 2018-08-27 LAB — GLUCOSE, CAPILLARY
GLUCOSE-CAPILLARY: 153 mg/dL — AB (ref 70–99)
Glucose-Capillary: 207 mg/dL — ABNORMAL HIGH (ref 70–99)
Glucose-Capillary: 164 mg/dL — ABNORMAL HIGH (ref 70–99)
Glucose-Capillary: 209 mg/dL — ABNORMAL HIGH (ref 70–99)

## 2018-08-27 LAB — MAGNESIUM: Magnesium: 2 mg/dL (ref 1.7–2.4)

## 2018-08-27 MED ORDER — IPRATROPIUM-ALBUTEROL 0.5-2.5 (3) MG/3ML IN SOLN
3.0000 mL | Freq: Four times a day (QID) | RESPIRATORY_TRACT | Status: DC
  Administered 2018-08-28: 3 mL via RESPIRATORY_TRACT
  Filled 2018-08-27: qty 3

## 2018-08-27 MED ORDER — FLUTICASONE PROPIONATE 50 MCG/ACT NA SUSP
2.0000 | Freq: Every day | NASAL | Status: DC
  Administered 2018-08-27 – 2018-08-28 (×2): 2 via NASAL
  Filled 2018-08-27: qty 16

## 2018-08-27 MED ORDER — METHYLPREDNISOLONE SODIUM SUCC 40 MG IJ SOLR
40.0000 mg | Freq: Four times a day (QID) | INTRAMUSCULAR | Status: AC
  Administered 2018-08-27 – 2018-08-28 (×4): 40 mg via INTRAVENOUS
  Filled 2018-08-27 (×4): qty 1

## 2018-08-27 MED ORDER — POTASSIUM CHLORIDE CRYS ER 20 MEQ PO TBCR
40.0000 meq | EXTENDED_RELEASE_TABLET | Freq: Once | ORAL | Status: AC
  Administered 2018-08-27: 40 meq via ORAL
  Filled 2018-08-27: qty 2

## 2018-08-27 MED ORDER — INSULIN ASPART 100 UNIT/ML ~~LOC~~ SOLN
0.0000 [IU] | Freq: Three times a day (TID) | SUBCUTANEOUS | Status: DC
  Administered 2018-08-27: 3 [IU] via SUBCUTANEOUS
  Administered 2018-08-28 (×2): 1 [IU] via SUBCUTANEOUS

## 2018-08-27 MED ORDER — ACETAMINOPHEN 325 MG PO TABS
650.0000 mg | ORAL_TABLET | ORAL | Status: DC
  Administered 2018-08-27 – 2018-08-28 (×6): 650 mg via ORAL
  Filled 2018-08-27 (×6): qty 2

## 2018-08-27 MED ORDER — ATENOLOL 25 MG PO TABS
50.0000 mg | ORAL_TABLET | Freq: Every day | ORAL | Status: DC
  Administered 2018-08-27 – 2018-08-28 (×2): 50 mg via ORAL
  Filled 2018-08-27 (×2): qty 2

## 2018-08-27 MED ORDER — INSULIN ASPART 100 UNIT/ML ~~LOC~~ SOLN: 0.0000 [IU] | Freq: Every day | SUBCUTANEOUS | Status: DC

## 2018-08-27 MED ORDER — PSEUDOEPHEDRINE HCL 60 MG PO TABS: 60.0000 mg | ORAL_TABLET | Freq: Four times a day (QID) | ORAL | Status: DC | PRN

## 2018-08-27 NOTE — Evaluation (Signed)
Physical Therapy Evaluation Patient Details Name: Melissa Odom MRN: [id number] DOB: 07-Apr-1951 Today's Date: 08/27/2018   History of Present Illness  Melissa Odom is a 68 y.o. female with medical history significant of anxiety, tobacco use, hypertension, hypothyroidism, is brought to the hospital with cough.  History is limited since patient is somnolent.  No family is available for additional history.  Per the medical record, patient has been increasingly confused.  She has been febrile.  She has been coughing for the past week.  She has been sleeping more.  She is very poor appetite.  She has been increasingly weak.    Clinical Impression  Patient demonstrates good return for sitting up at bedside, sit to stands, transfers without using an AD, but for longer distances beyond bedside patient very unsteady and becomes SOB with SpO2 dropping below 85% while on room air resulting in decreased balance requiring more assistance to make it back to room.  Patient tolerated sitting up in chair with 2 LPM O2 after therapy and encouraged to stay out of bed as much as tolerated.  Patient will benefit from continued physical therapy in hospital and recommended venue below to increase strength, balance, endurance for safe ADLs and gait.    Follow Up Recommendations Home health PT;Supervision - Intermittent    Equipment Recommendations  Rolling walker with 5" wheels    Recommendations for Other Services       Precautions / Restrictions Precautions Precautions: Fall Restrictions Weight Bearing Restrictions: No      Mobility  Bed Mobility Overal bed mobility: Modified Independent             General bed mobility comments: increased time  Transfers Overall transfer level: Needs assistance Equipment used: None Transfers: Sit to/from Stand;Stand Pivot Transfers Sit to Stand: Supervision Stand pivot transfers: Supervision       General transfer comment: slightly labored  movement  Ambulation/Gait Ambulation/Gait assistance: Min guard;Min assist Gait Distance (Feet): 25 Feet Assistive device: None Gait Pattern/deviations: Decreased step length - right;Decreased step length - left;Decreased stride length Gait velocity: decreased   General Gait Details: unsteady slow labored cadence with frequent stumbling with near loss of balance, mostly limited due to SOB with SpO2 desaturation below 85% while on room air  Stairs            Wheelchair Mobility    Modified Rankin (Stroke Patients Only)       Balance Overall balance assessment: Needs assistance Sitting-balance support: Feet supported;No upper extremity supported Sitting balance-Leahy Scale: Good     Standing balance support: During functional activity;No upper extremity supported Standing balance-Leahy Scale: Poor Standing balance comment: fair/poor without AD, required hand held assist                             Pertinent Vitals/Pain Pain Assessment: No/denies pain    Home Living Family/patient expects to be discharged to:: Private residence Living Arrangements: Spouse/significant other Available Help at Discharge: Family;Available PRN/intermittently Type of Home: House Home Access: Stairs to enter Entrance Stairs-Rails: Left;Right;Can reach both Entrance Stairs-Number of Steps: 4 Home Layout: One level Home Equipment: Shower seat;Bedside commode      Prior Function Level of Independence: Independent         Comments: household and short distanced community ambulator     Higher education careers adviser        Extremity/Trunk Assessment   Upper Extremity Assessment Upper Extremity Assessment: Overall WFL for tasks assessed  Lower Extremity Assessment Lower Extremity Assessment: Generalized weakness    Cervical / Trunk Assessment Cervical / Trunk Assessment: Normal  Communication   Communication: No difficulties  Cognition Arousal/Alertness:  Awake/alert Behavior During Therapy: WFL for tasks assessed/performed Overall Cognitive Status: Within Functional Limits for tasks assessed                                        General Comments      Exercises     Assessment/Plan    PT Assessment Patient needs continued PT services  PT Problem List Decreased strength;Decreased activity tolerance;Decreased balance;Decreased mobility       PT Treatment Interventions Gait training;Therapeutic exercise;Stair training;Functional mobility training;Therapeutic activities;Patient/family education    PT Goals (Current goals can be found in the Care Plan section)  Acute Rehab PT Goals Patient Stated Goal: return home with spouse to assist PT Goal Formulation: With patient Time For Goal Achievement: 08/31/18 Potential to Achieve Goals: Good    Frequency Min 3X/week   Barriers to discharge        Co-evaluation               AM-PAC PT "6 Clicks" Mobility  Outcome Measure Help needed turning from your back to your side while in a flat bed without using bedrails?: None Help needed moving from lying on your back to sitting on the side of a flat bed without using bedrails?: None Help needed moving to and from a bed to a chair (including a wheelchair)?: A Little Help needed standing up from a chair using your arms (e.g., wheelchair or bedside chair)?: A Little Help needed to walk in hospital room?: A Lot Help needed climbing 3-5 steps with a railing? : A Lot 6 Click Score: 18    End of Session   Activity Tolerance: Patient tolerated treatment well;Patient limited by fatigue Patient left: in chair;with call bell/phone within reach Nurse Communication: Mobility status PT Visit Diagnosis: Unsteadiness on feet (R26.81);Other abnormalities of gait and mobility (R26.89);Muscle weakness (generalized) (M62.81)    Time: 4270-6237 PT Time Calculation (min) (ACUTE ONLY): 24 min   Charges:   PT Evaluation $PT Eval  Moderate Complexity: 1 Mod PT Treatments $Therapeutic Activity: 23-37 mins        3:05 PM, 08/27/18 Ocie Bob, MPT Physical Therapist with [facility] [phone] office [phone] mobile phone

## 2018-08-27 NOTE — Care Management Note (Addendum)
Case Management Note  Patient Details  Name: KENZLEI KEMNITZ MRN: [id number] Date of Birth: [birth year]  Subjective/Objective:       Community acquired pneumonia. From home with husband. No assistive devices pta.  Acutely on oxygen. Per PT, patient had desaturation during PT assessment.  Recommended for home health PT. CMS list provided. She would like to discuss with husband. Declines RW.        Action/Plan: DC home. Will f/u prior to DC about HH.   Expected Discharge Date:  08/27/18               Expected Discharge Plan:     In-House Referral:     Discharge planning Services  CM Consult  Post Acute Care Choice:    Choice offered to:     DME Arranged:    DME Agency:     HH Arranged:    HH Agency:     Status of Service:  In process, will continue to follow  If discussed at Long Length of Stay Meetings, dates discussed:    Additional Comments:  Aideen Fenster, Chrystine Oiler, RN 08/27/2018, 3:26 PM

## 2018-08-27 NOTE — Progress Notes (Signed)
PROGRESS NOTE  Melissa Odom  XIP:[id number]  DOB: [birth year]  DOA: 08/25/2018 PCP: Kirstie Peri, MD  Brief Admission Hx: 68 y.o. female with medical history significant of anxiety, tobacco use, hypertension, hypothyroidism, is brought to the hospital with cough, influenza A and pneumonia.   MDM/Assessment & Plan:   1. Community-acquired pneumonia - Continue intravenous antibiotics with ceftriaxone and azithromycin.  Continue pulmonary hygiene.  Hopeful to discharge home tomorrow.  2. Influenza A -  Continue Tamiflu dosed by pharmacy x 5 days total.   3. Acute respiratory failure with hypoxia.  Secondary to pneumonia.  Wean off oxygen as tolerated.   4. COPD exacerbation - added steroids, continue nebs and antibiotics.  5. UTI.  Urinalysis indicates possible infection.  Follow-up on cultures.  She is already on antibiotics. 6. Acute encephalopathy. Resolving.  Likely multifactorial related to acute infectious process as well as her medications.  She is on several sedative medications.  Continue to monitor. 7. Anxiety - clonazepam ordered as needed.  Resumed zoloft and buspar.  8. Hypertension.  Temporarily hold antihypertensives for now since blood pressures are soft. 9. Steroid induced hyperglycemia - sensitive SSI ordered.   DVT prophylaxis: Lovenox Code Status: Full code Family Communication: No family present, unable to reach husband over the phone, wife says he is working 2 jobs right now.  Disposition Plan: Discharge home tomorrow if stable.  Consults called:   Admission status: Inpatient med surg  Procedures:    Antimicrobials:  oseltamivir 2/29 >  azithromycin 2/29 >  Ceftriaxone 2/29 >  Subjective: Pt reports improved cough mostly nonproductive but persistent sinus pain and drainage.    Objective: Vitals:   08/27/18 0507 08/27/18 0709 08/27/18 1158 08/27/18 1400  BP: 118/72   121/78  Pulse: (!) 101   (!) 107  Resp: 15   19  Temp: 97.7 F (36.5 C)    (!) 97.5 F (36.4 C)  TempSrc: Oral     SpO2: 100% 99% 98% 91%  Weight:      Height:        Intake/Output Summary (Last 24 hours) at 08/27/2018 1525 Last data filed at 08/27/2018 1500 Gross per 24 hour  Intake 960 ml  Output -  Net 960 ml   Filed Weights   08/25/18 0730  Weight: 52.2 kg   REVIEW OF SYSTEMS  As per history otherwise all reviewed and reported negative  Exam:  General exam: awake, alert, NAD, oriented x 2.  Respiratory system: BBS mostly clear. Mild increased work of breathing. Cardiovascular system: S1 & S2 heard. No JVD, murmurs, gallops, clicks or pedal edema. Gastrointestinal system: Abdomen is nondistended, soft and nontender. Normal bowel sounds heard. Central nervous system: Alert and oriented. No focal neurological deficits. Extremities: no cyanosis or clubbing.  Data Reviewed: Basic Metabolic Panel: Recent Labs  Lab 08/25/18 0822 08/26/18 0622 08/27/18 0559  NA 138 142 140  K 3.5 3.5 2.7*  CL 100 106 106  CO2 27 28 26   GLUCOSE 148* 117* 207*  BUN 8 7* 8  CREATININE 0.72 0.66 0.70  CALCIUM 8.6* 8.3* 8.1*  MG  --   --  2.0   Liver Function Tests: Recent Labs  Lab 08/25/18 0822 08/27/18 0559  AST 65* 24  ALT 147* 68*  ALKPHOS 109 67  BILITOT 0.4 0.2*  PROT 8.0 6.5  ALBUMIN 4.1 3.0*   No results for input(s): LIPASE, AMYLASE in the last 168 hours. No results for input(s): AMMONIA in the last 168 hours. CBC:  Recent Labs  Lab 08/25/18 0822 08/26/18 0622 08/27/18 0559  WBC 8.2 11.0* 7.6  NEUTROABS 6.6  --  6.0  HGB 12.0 10.4* 9.4*  HCT 38.4 34.1* 31.2*  MCV 107.3* 110.7* 110.2*  PLT 210 193 189   Cardiac Enzymes: No results for input(s): CKTOTAL, CKMB, CKMBINDEX, TROPONINI in the last 168 hours. CBG (last 3)  Recent Labs    08/26/18 2100 08/27/18 0725 08/27/18 1131  GLUCAP 337* 207* 164*   Recent Results (from the past 240 hour(s))  Blood culture (routine x 2)     Status: None (Preliminary result)   Collection Time:  08/25/18  8:23 AM  Result Value Ref Range Status   Specimen Description   Final    BLOOD RIGHT ARM BOTTLES DRAWN AEROBIC AND ANAEROBIC   Special Requests   Final    Blood Culture results may not be optimal due to an inadequate volume of blood received in culture bottles   Culture   Final    NO GROWTH 2 DAYS Performed at [facility], [address], [place], [place] [address]    Report Status PENDING  Incomplete  Blood culture (routine x 2)     Status: None (Preliminary result)   Collection Time: 08/25/18  8:24 AM  Result Value Ref Range Status   Specimen Description BLOOD LEFT ARM BOTTLES DRAWN AEROBIC AND ANAEROBIC  Final   Special Requests Blood Culture adequate volume  Final   Culture   Final    NO GROWTH 2 DAYS Performed at [facility], [address] [place], [place] [address]    Report Status PENDING  Incomplete  Urine culture     Status: Abnormal (Preliminary result)   Collection Time: 08/25/18  9:41 AM  Result Value Ref Range Status   Specimen Description   Final    URINE, CLEAN CATCH Performed at [facility], [address] [place], [place] [address]    Special Requests   Final    NONE Performed at [facility], [address], [place], [place] [address]    Culture >=100,000 COLONIES/mL ESCHERICHIA COLI (A)  Final   Report Status PENDING  Incomplete     Studies: No results found. Scheduled Meds: . acetaminophen  650 mg Oral Q4H  . budesonide (PULMICORT) nebulizer solution  0.25 mg Nebulization BID  . busPIRone  15 mg Oral TID  . enoxaparin (LOVENOX) injection  40 mg Subcutaneous Q24H  . gabapentin  400 mg Oral TID  . guaiFENesin  1,200 mg Oral BID  . insulin aspart  0-5 Units Subcutaneous QHS  . insulin aspart  0-9 Units Subcutaneous TID WC  . ipratropium-albuterol  3 mL Nebulization Q4H  . levothyroxine  88 mcg Oral Daily  . methylPREDNISolone (SOLU-MEDROL) injection  40 mg Intravenous Q6H  . oseltamivir  30 mg Oral BID  . sertraline  50 mg Oral Daily    Continuous Infusions: . sodium chloride 50 mL/hr at 08/26/18 1656  . azithromycin 500 mg (08/27/18 1309)  . cefTRIAXone (ROCEPHIN)  IV 1 g (08/27/18 1429)    Active Problems:   CAP (community acquired pneumonia)   Acute respiratory failure with hypoxia (HCC)   Influenza A   HTN (hypertension)   Acute lower UTI   Hypothyroidism   Anxiety   Acute encephalopathy   Tobacco abuse   COPD (chronic obstructive pulmonary disease) (HCC)  Time spent:   Standley Dakins, MD [facility] 08/27/2018, 3:25 PM    LOS: 2 days  How to contact the  [facility] Attending or Consulting provider 7A - 7P or covering provider during after hours 7P -7A, for this patient?  1. Check the care team in [facility] and look for a) attending/consulting [facility] provider listed and b) the [facility] team listed 2. Log into [email or web address] and use [place]'s universal password to access. If you do not have the password, please contact the hospital operator. 3. Locate the [facility] provider you are looking for under [facility] and page to a number that you can be directly reached. 4. If you still have difficulty reaching the provider, please page the [facility] (Director on Call) for the Hospitalists listed on amion for assistance.

## 2018-08-27 NOTE — Plan of Care (Signed)
  Problem: Acute Rehab PT Goals(only PT should resolve) Goal: Pt Will Go Supine/Side To Sit Outcome: Progressing Flowsheets (Taken 08/27/2018 1506) Pt will go Supine/Side to Sit: Independently Goal: Patient Will Transfer Sit To/From Stand Outcome: Progressing Flowsheets (Taken 08/27/2018 1506) Patient will transfer sit to/from stand: with modified independence Goal: Pt Will Transfer Bed To Chair/Chair To Bed Outcome: Progressing Flowsheets (Taken 08/27/2018 1506) Pt will Transfer Bed to Chair/Chair to Bed: with modified independence Goal: Pt Will Ambulate Outcome: Progressing Flowsheets (Taken 08/27/2018 1506) Pt will Ambulate: with supervision; with rolling walker; 75 feet   3:07 PM, 08/27/18 Ocie Bob, MPT Physical Therapist with [facility] [phone] office [phone] mobile phone

## 2018-08-27 NOTE — Progress Notes (Signed)
Inpatient Diabetes Program Recommendations  AACE/ADA: New Consensus Statement on Inpatient Glycemic Control (2015)  Target Ranges:  Prepandial:   less than 140 mg/dL      Peak postprandial:   less than 180 mg/dL (1-2 hours)      Critically ill patients:  140 - 180 mg/dL   Lab Results  Component Value Date   GLUCAP 164 (H) 08/27/2018    Review of Glycemic Control  Inpatient Diabetes Program Recommendations:   No prior DM hx noted. While on steroids, consider Novolog sensitive correction tid.  Thank you, Billy Fischer. Chrystle Murillo, RN, MSN, CDE  Diabetes Coordinator Inpatient Glycemic Control Team Team Pager [phone] (8am-5pm) 08/27/2018 2:09 PM

## 2018-08-27 NOTE — Care Management Important Message (Signed)
Important Message  Patient Details  Name: Melissa Odom MRN: [id number] Date of Birth: Oct 29, 1950   Medicare Important Message Given:  Yes    Annora Guderian, Chrystine Oiler, RN 08/27/2018, 3:43 PM

## 2018-08-28 LAB — URINE CULTURE: Culture: >=100000 — AB

## 2018-08-28 LAB — RENAL FUNCTION PANEL
Albumin: 2.8 g/dL — ABNORMAL LOW (ref 3.5–5.0)
Anion gap: 8 (ref 5–15)
BUN: 13 mg/dL (ref 8–23)
CO2: 27 mmol/L (ref 22–32)
Calcium: 8.3 mg/dL — ABNORMAL LOW (ref 8.9–10.3)
Chloride: 107 mmol/L (ref 98–111)
Creatinine, Ser: 0.7 mg/dL (ref 0.44–1.00)
GFR calc Af Amer: >60 mL/min (ref >60)
GFR calc non Af Amer: >60 mL/min (ref >60)
Glucose, Bld: 149 mg/dL — ABNORMAL HIGH (ref 70–99)
Phosphorus: 2 mg/dL — ABNORMAL LOW (ref 2.5–4.6)
Potassium: 4 mmol/L (ref 3.5–5.1)
Sodium: 142 mmol/L (ref 135–145)

## 2018-08-28 LAB — GLUCOSE, CAPILLARY
Glucose-Capillary: 138 mg/dL — ABNORMAL HIGH (ref 70–99)
Glucose-Capillary: 126 mg/dL — ABNORMAL HIGH (ref 70–99)

## 2018-08-28 MED ORDER — GABAPENTIN 600 MG PO TABS: 600.0000 mg | ORAL_TABLET | Freq: Three times a day (TID) | ORAL | Status: AC

## 2018-08-28 MED ORDER — DOXYCYCLINE HYCLATE 100 MG PO CAPS: 100.0000 mg | ORAL_CAPSULE | Freq: Two times a day (BID) | ORAL | 0 refills | Status: AC

## 2018-08-28 MED ORDER — GUAIFENESIN ER 600 MG PO TB12: 1200.0000 mg | ORAL_TABLET | Freq: Two times a day (BID) | ORAL | 0 refills | Status: AC

## 2018-08-28 MED ORDER — OSELTAMIVIR PHOSPHATE 30 MG PO CAPS: 30.0000 mg | ORAL_CAPSULE | Freq: Two times a day (BID) | ORAL | 0 refills | Status: AC

## 2018-08-28 NOTE — Discharge Summary (Signed)
Physician Discharge Summary  CHLOE MIYOSHI BJY:[id number] DOB: 05-24-1951 DOA: 08/25/2018  PCP: Kirstie Peri, MD  Admit date: 08/25/2018 Discharge date: 08/28/2018  Admitted From: Home  Disposition: Home   Recommendations for Outpatient Follow-up:  1. Follow up with PCP in 1 weeks  Home Health: PT   Discharge Condition: STABLE   CODE STATUS: FULL    Brief Hospitalization Summary: Please see all hospital notes, images, labs for full details of the hospitalization. Dr. Benetta Spar HPI: NERIAH BROTT is a 68 y.o. female with medical history significant of anxiety, tobacco use, hypertension, hypothyroidism, is brought to the hospital with cough.  History is limited since patient is somnolent.  No family is available for additional history.  Per the medical record, patient has been increasingly confused.  She has been febrile.  She has been coughing for the past week.  She has been sleeping more.  She is very poor appetite.  She has been increasingly weak.  ED Course: On arrival to the emergency room, she was noted to be hypoxic.  She also febrile with a temperature of 103.  She is tachycardic.  Blood pressure stable.  She is given Tylenol which improved her fever.  Influenza PCR is positive.  Chest x-ray indicates developing pneumonia.  Urinalysis indicates possible infection.  She was started on intravenous antibiotics.  She is receiving IV fluids.  She has been referred for admission.       Brief Admission Hx: 68 y.o.femalewith medical history significant ofanxiety, tobacco use, hypertension, hypothyroidism, is brought to the hospital with cough, influenza A and pneumonia.   MDM/Assessment & Plan:   1. Community-acquired pneumonia -Treated with intravenous antibiotics with ceftriaxone and azithromycin. Continue pulmonary hygiene.  discharge home on doxycycline.   2. Influenza A - Continue Tamiflu dosed by pharmacy x 5 days total.   3. Acute respiratory failure with hypoxia.  Resolved.  Secondary to pneumonia. 4. COPD exacerbation - added steroids, continue nebs and antibiotics.  5. UTI. Urinalysis indicates possible infection. Follow-up on cultures. Treated with ceftriaxone.   6. Acute encephalopathy. Resolved.  Likely multifactorial related to acute infectious process as well as her medications. She is on several sedative medications.  Reduced dose of gabapentin per pharmacy recommendations. Follow up with PCP. 7. Anxiety - clonazepam ordered as needed.  Resumed zoloft and buspar.  8. Hypertension. Temporarily hold antihypertensives for now since blood pressures are soft.   DVT prophylaxis:Lovenox Code Status:Full code Family Communication:No family present, unable to reach husband over the phone, wife says he is working 2 jobs right now.  Disposition Plan:Discharge home  Consults called: Admission status:Inpatient med surg  Procedures:    Antimicrobials:  oseltamivir 2/29 >  azithromycin 2/29 >3/3  Ceftriaxone 2/29 >3/3  Discharge Diagnoses:  Active Problems:   CAP (community acquired pneumonia)   Acute respiratory failure with hypoxia (HCC)   Influenza A   HTN (hypertension)   Acute lower UTI   Hypothyroidism   Anxiety   Tobacco abuse   COPD (chronic obstructive pulmonary disease) (HCC)   Discharge Instructions: Discharge Instructions    Call MD for:  difficulty breathing, headache or visual disturbances   Complete by:  As directed    Call MD for:  persistant dizziness or light-headedness   Complete by:  As directed    Call MD for:  persistant nausea and vomiting   Complete by:  As directed    Call MD for:  temperature >100.4   Complete by:  As directed  Increase activity slowly   Complete by:  As directed      Allergies as of 08/28/2018      Reactions   Morphine Anaphylaxis   Sulfamethoxazole Rash   Unknown      Medication List    TAKE these medications   atenolol 50 MG tablet Commonly known as:   TENORMIN Take 50 mg by mouth daily.   BREO ELLIPTA 100-25 MCG/INH Aepb Generic drug:  fluticasone furoate-vilanterol Inhale 1 puff into the lungs daily.   busPIRone 15 MG tablet Commonly known as:  BUSPAR Take 15 mg by mouth 3 (three) times daily.   clonazePAM 1 MG tablet Commonly known as:  KLONOPIN Take 1 mg by mouth 3 (three) times daily as needed for anxiety.   doxycycline 100 MG capsule Commonly known as:  VIBRAMYCIN Take 1 capsule (100 mg total) by mouth 2 (two) times daily for 3 days.   gabapentin 600 MG tablet Commonly known as:  NEURONTIN Take 1 tablet (600 mg total) by mouth 3 (three) times daily. What changed:  when to take this   guaiFENesin 600 MG 12 hr tablet Commonly known as:  MUCINEX Take 2 tablets (1,200 mg total) by mouth 2 (two) times daily for 3 days.   levothyroxine 88 MCG tablet Commonly known as:  SYNTHROID, LEVOTHROID Take 88 mcg by mouth daily.   oseltamivir 30 MG capsule Commonly known as:  TAMIFLU Take 1 capsule (30 mg total) by mouth 2 (two) times daily for 1 day.   sertraline 100 MG tablet Commonly known as:  ZOLOFT Take 100 mg by mouth daily.   traMADol 50 MG tablet Commonly known as:  ULTRAM Take 50 mg by mouth 2 (two) times daily.   Vitamin D (Ergocalciferol) 1.25 MG (50000 UT) Caps capsule Commonly known as:  DRISDOL Take 50,000 Units by mouth every 7 (seven) days.      Follow-up Information    Kirstie Peri, MD. Schedule an appointment as soon as possible for a visit in 1 week(s).   Specialty:  Internal Medicine Why:  Hospital Follow Up  Contact information: [address] [place] [address] [phone]          Allergies  Allergen Reactions  . Morphine Anaphylaxis  . Sulfamethoxazole Rash    Unknown   Allergies as of 08/28/2018      Reactions   Morphine Anaphylaxis   Sulfamethoxazole Rash   Unknown      Medication List    TAKE these medications   atenolol 50 MG tablet Commonly known as:  TENORMIN Take 50  mg by mouth daily.   BREO ELLIPTA 100-25 MCG/INH Aepb Generic drug:  fluticasone furoate-vilanterol Inhale 1 puff into the lungs daily.   busPIRone 15 MG tablet Commonly known as:  BUSPAR Take 15 mg by mouth 3 (three) times daily.   clonazePAM 1 MG tablet Commonly known as:  KLONOPIN Take 1 mg by mouth 3 (three) times daily as needed for anxiety.   doxycycline 100 MG capsule Commonly known as:  VIBRAMYCIN Take 1 capsule (100 mg total) by mouth 2 (two) times daily for 3 days.   gabapentin 600 MG tablet Commonly known as:  NEURONTIN Take 1 tablet (600 mg total) by mouth 3 (three) times daily. What changed:  when to take this   guaiFENesin 600 MG 12 hr tablet Commonly known as:  MUCINEX Take 2 tablets (1,200 mg total) by mouth 2 (two) times daily for 3 days.   levothyroxine 88 MCG tablet Commonly  known as:  SYNTHROID, LEVOTHROID Take 88 mcg by mouth daily.   oseltamivir 30 MG capsule Commonly known as:  TAMIFLU Take 1 capsule (30 mg total) by mouth 2 (two) times daily for 1 day.   sertraline 100 MG tablet Commonly known as:  ZOLOFT Take 100 mg by mouth daily.   traMADol 50 MG tablet Commonly known as:  ULTRAM Take 50 mg by mouth 2 (two) times daily.   Vitamin D (Ergocalciferol) 1.25 MG (50000 UT) Caps capsule Commonly known as:  DRISDOL Take 50,000 Units by mouth every 7 (seven) days.       Procedures/Studies: Dg Chest 2 View  Result Date: 08/25/2018 CLINICAL DATA:  Fever and cough EXAM: CHEST - 2 VIEW COMPARISON:  None. FINDINGS: Cardiac shadows within normal limits. Aortic calcifications are again noted. Lungs are well aerated bilaterally with patchy bronchitic changes. More focal infiltrate is noted in the left upper lobe. No sizable effusion is noted. IMPRESSION: Increased bronchitic changes with more focal infiltrate in the left apex. Followup PA and lateral chest X-ray is recommended in 3-4 weeks following trial of antibiotic therapy to ensure resolution and  exclude underlying malignancy. Electronically Signed   By: Alcide Clever M.D.   On: 08/25/2018 09:07      Subjective: Pt says she feels much better. She would like to go home today.   Discharge Exam: Vitals:   08/28/18 0509 08/28/18 0735  BP: 132/80   Pulse: 89   Resp: 18   Temp: 98.5 F (36.9 C)   SpO2: 90% 94%   Vitals:   08/27/18 1947 08/27/18 2134 08/28/18 0509 08/28/18 0735  BP:  128/80 132/80   Pulse: 79 84 89   Resp: 18 20 18    Temp:  98.2 F (36.8 C) 98.5 F (36.9 C)   TempSrc:  Oral Oral   SpO2: (!) 89% 98% 90% 94%  Weight:      Height:       General exam: awake, alert, NAD, oriented x 2.  Respiratory system: BBS mostly clear. Mild increased work of breathing. Cardiovascular system: S1 & S2 heard. No JVD, murmurs, gallops, clicks or pedal edema. Gastrointestinal system: Abdomen is nondistended, soft and nontender. Normal bowel sounds heard. Central nervous system: Alert and oriented. No focal neurological deficits. Extremities: no cyanosis or clubbing.   The results of significant diagnostics from this hospitalization (including imaging, microbiology, ancillary and laboratory) are listed below for reference.     Microbiology: Recent Results (from the past 240 hour(s))  Blood culture (routine x 2)     Status: None (Preliminary result)   Collection Time: 08/25/18  8:23 AM  Result Value Ref Range Status   Specimen Description   Final    BLOOD RIGHT ARM BOTTLES DRAWN AEROBIC AND ANAEROBIC   Special Requests   Final    Blood Culture results may not be optimal due to an inadequate volume of blood received in culture bottles   Culture   Final    NO GROWTH 3 DAYS Performed at [facility], [address] [place], [place] [address]    Report Status PENDING  Incomplete  Blood culture (routine x 2)     Status: None (Preliminary result)   Collection Time: 08/25/18  8:24 AM  Result Value Ref Range Status   Specimen Description BLOOD LEFT ARM BOTTLES DRAWN AEROBIC  AND ANAEROBIC  Final   Special Requests Blood Culture adequate volume  Final   Culture   Final    NO GROWTH 3  DAYS Performed at [facility], [address], [place], [place] [address]    Report Status PENDING  Incomplete  Urine culture     Status: Abnormal   Collection Time: 08/25/18  9:41 AM  Result Value Ref Range Status   Specimen Description   Final    URINE, CLEAN CATCH Performed at [facility], [address], [place], [place] [address]    Special Requests   Final    NONE Performed at [facility], [address] [place], [place] [address]    Culture >=100,000 COLONIES/mL ESCHERICHIA COLI (A)  Final   Report Status 08/28/2018 FINAL  Final   Organism ID, Bacteria ESCHERICHIA COLI (A)  Final      Susceptibility   Escherichia coli - MIC*    AMPICILLIN >=32 RESISTANT Resistant     CEFAZOLIN 16 SENSITIVE Sensitive     CEFTRIAXONE <=1 SENSITIVE Sensitive     CIPROFLOXACIN <=0.25 SENSITIVE Sensitive     GENTAMICIN <=1 SENSITIVE Sensitive     IMIPENEM <=0.25 SENSITIVE Sensitive     NITROFURANTOIN <=16 SENSITIVE Sensitive     TRIMETH/SULFA <=20 SENSITIVE Sensitive     AMPICILLIN/SULBACTAM >=32 RESISTANT Resistant     PIP/TAZO 8 SENSITIVE Sensitive     Extended ESBL NEGATIVE Sensitive     * >=100,000 COLONIES/mL ESCHERICHIA COLI     Labs: BNP (last 3 results) No results for input(s): BNP in the last 8760 hours. Basic Metabolic Panel: Recent Labs  Lab 08/25/18 0822 08/26/18 0622 08/27/18 0559 08/28/18 0431  NA 138 142 140 142  K 3.5 3.5 2.7* 4.0  CL 100 106 106 107  CO2 GLUCOSE 148* 117* 207* 149*  BUN 8 7* 8 13  CREATININE 0.72 0.66 0.70 0.70  CALCIUM 8.6* 8.3* 8.1* 8.3*  MG  --   --  2.0  --   PHOS  --   --   --  2.0*   Liver Function Tests: Recent Labs  Lab 08/25/18 0822 08/27/18 0559 08/28/18 0431  AST 65* 24  --   ALT 147* 68*  --   ALKPHOS 109 67  --   BILITOT 0.4 0.2*  --   PROT 8.0 6.5  --   ALBUMIN 4.1 3.0* 2.8*   No results for  input(s): LIPASE, AMYLASE in the last 168 hours. No results for input(s): AMMONIA in the last 168 hours. CBC: Recent Labs  Lab 08/25/18 0822 08/26/18 0622 08/27/18 0559  WBC 8.2 11.0* 7.6  NEUTROABS 6.6  --  6.0  HGB 12.0 10.4* 9.4*  HCT 38.4 34.1* 31.2*  MCV 107.3* 110.7* 110.2*  PLT 210 193 189   Cardiac Enzymes: No results for input(s): CKTOTAL, CKMB, CKMBINDEX, TROPONINI in the last 168 hours. BNP: Invalid input(s): POCBNP CBG: Recent Labs  Lab 08/27/18 0725 08/27/18 1131 08/27/18 1615 08/27/18 2136 08/28/18 0739  GLUCAP 207* 164* 209* 153* 138*   D-Dimer No results for input(s): DDIMER in the last 72 hours. Hgb A1c No results for input(s): HGBA1C in the last 72 hours. Lipid Profile No results for input(s): CHOL, HDL, LDLCALC, TRIG, CHOLHDL, LDLDIRECT in the last 72 hours. Thyroid function studies No results for input(s): TSH, T4TOTAL, T3FREE, THYROIDAB in the last 72 hours.  Invalid input(s): FREET3 Anemia work up No results for input(s): VITAMINB12, FOLATE, FERRITIN, TIBC, IRON, RETICCTPCT in the last 72 hours. Urinalysis    Component Value Date/Time   COLORURINE AMBER (A) 08/25/2018 0749   APPEARANCEUR HAZY (A) 08/25/2018 1308  LABSPEC 1.019 08/25/2018 0749   PHURINE 5.0 08/25/2018 0749   GLUCOSEU NEGATIVE 08/25/2018 0749   HGBUR SMALL (A) 08/25/2018 0749   BILIRUBINUR NEGATIVE 08/25/2018 0749   KETONESUR NEGATIVE 08/25/2018 0749   PROTEINUR 100 (A) 08/25/2018 0749   NITRITE POSITIVE (A) 08/25/2018 0749   LEUKOCYTESUR NEGATIVE 08/25/2018 0749   Sepsis Labs Invalid input(s): PROCALCITONIN,  WBC,  LACTICIDVEN Microbiology Recent Results (from the past 240 hour(s))  Blood culture (routine x 2)     Status: None (Preliminary result)   Collection Time: 08/25/18  8:23 AM  Result Value Ref Range Status   Specimen Description   Final    BLOOD RIGHT ARM BOTTLES DRAWN AEROBIC AND ANAEROBIC   Special Requests   Final    Blood Culture results may not be  optimal due to an inadequate volume of blood received in culture bottles   Culture   Final    NO GROWTH 3 DAYS Performed at [facility], [address], [place], [place] [address]    Report Status PENDING  Incomplete  Blood culture (routine x 2)     Status: None (Preliminary result)   Collection Time: 08/25/18  8:24 AM  Result Value Ref Range Status   Specimen Description BLOOD LEFT ARM BOTTLES DRAWN AEROBIC AND ANAEROBIC  Final   Special Requests Blood Culture adequate volume  Final   Culture   Final    NO GROWTH 3 DAYS Performed at [facility], [address] [place], [place] [address]    Report Status PENDING  Incomplete  Urine culture     Status: Abnormal   Collection Time: 08/25/18  9:41 AM  Result Value Ref Range Status   Specimen Description   Final    URINE, CLEAN CATCH Performed at [facility], [address], [place], [place] [address]    Special Requests   Final    NONE Performed at [facility], [address], [place], [place] [address]    Culture >=100,000 COLONIES/mL ESCHERICHIA COLI (A)  Final   Report Status 08/28/2018 FINAL  Final   Organism ID, Bacteria ESCHERICHIA COLI (A)  Final      Susceptibility   Escherichia coli - MIC*    AMPICILLIN >=32 RESISTANT Resistant     CEFAZOLIN 16 SENSITIVE Sensitive     CEFTRIAXONE <=1 SENSITIVE Sensitive     CIPROFLOXACIN <=0.25 SENSITIVE Sensitive     GENTAMICIN <=1 SENSITIVE Sensitive     IMIPENEM <=0.25 SENSITIVE Sensitive     NITROFURANTOIN <=16 SENSITIVE Sensitive     TRIMETH/SULFA <=20 SENSITIVE Sensitive     AMPICILLIN/SULBACTAM >=32 RESISTANT Resistant     PIP/TAZO 8 SENSITIVE Sensitive     Extended ESBL NEGATIVE Sensitive     * >=100,000 COLONIES/mL ESCHERICHIA COLI    Time coordinating discharge: 33 minutes   SIGNED:  Standley Dakins, MD  [facility] 08/28/2018, 10:34 AM How to contact the [facility] Attending or Consulting provider 7A - 7P or covering provider during after hours 7P -7A, for this  patient?  1. Check the care team in [facility] and look for a) attending/consulting [facility] provider listed and b) the [facility] team listed 2. Log into [email or web address] and use [facility]'s universal password to access. If you do not have the password, please contact the hospital operator. 3. Locate the [facility] provider you are looking for under [facility] and page to a number that you can be directly reached. 4. If you still have difficulty reaching the provider, please page the [facility] (Director  on Call) for the Hospitalists listed on amion for assistance.

## 2018-08-28 NOTE — Discharge Instructions (Signed)
IMPORTANT INFORMATION: PAY CLOSE ATTENTION   PHYSICIAN DISCHARGE INSTRUCTIONS  Follow with Primary care provider  Kirstie Peri, MD  and other consultants as instructed your Hospitalist Physician  SEEK MEDICAL CARE OR RETURN TO EMERGENCY ROOM IF SYMPTOMS COME BACK, WORSEN OR NEW PROBLEM DEVELOPS.   Please note: You were cared for by a hospitalist during your hospital stay. Every effort will be made to forward records to your primary care provider.  You can request that your primary care provider send for your hospital records if they have not received them.  Once you are discharged, your primary care physician will handle any further medical issues. Please note that NO REFILLS for any discharge medications will be authorized once you are discharged, as it is imperative that you return to your primary care physician (or establish a relationship with a primary care physician if you do not have one) for your post hospital discharge needs so that they can reassess your need for medications and monitor your lab values.  Please get a complete blood count and chemistry panel checked by your Primary MD at your next visit, and again as instructed by your Primary MD.  Get Medicines reviewed and adjusted: Please take all your medications with you for your next visit with your Primary MD  Laboratory/radiological data: Please request your Primary MD to go over all hospital tests and procedure/radiological results at the follow up, please ask your primary care provider to get all Hospital records sent to his/her office.  In some cases, they will be blood work, cultures and biopsy results pending at the time of your discharge. Please request that your primary care provider follow up on these results.  If you are diabetic, please bring your blood sugar readings with you to your follow up appointment with primary care.    Please call and make your follow up appointments as soon as possible.    Also Note the  following: If you experience worsening of your admission symptoms, develop shortness of breath, life threatening emergency, suicidal or homicidal thoughts you must seek medical attention immediately by calling 911 or calling your MD immediately  if symptoms less severe.  You must read complete instructions/literature along with all the possible adverse reactions/side effects for all the Medicines you take and that have been prescribed to you. Take any new Medicines after you have completely understood and accpet all the possible adverse reactions/side effects.   Do not drive when taking Pain medications or sleeping medications (Benzodiazepines)  Do not take more than prescribed Pain, Sleep and Anxiety Medications. It is not advisable to combine anxiety,sleep and pain medications without talking with your primary care practitioner  Special Instructions: If you have smoked or chewed Tobacco  in the last 2 yrs please stop smoking, stop any regular Alcohol  and or any Recreational drug use.  Wear Seat belts while driving.
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
## 2018-08-28 NOTE — Progress Notes (Signed)
Nsg Discharge Note  Admit Date:  08/25/2018 Discharge date: 08/28/2018   Melissa Odom to be D/C'd Home per MD order.  AVS completed.  Copy for chart, and copy for patient signed, and dated. Patient/caregiver able to verbalize understanding.  Discharge Medication: Allergies as of 08/28/2018      Reactions   Morphine Anaphylaxis   Sulfamethoxazole Rash   Unknown      Medication List    TAKE these medications   atenolol 50 MG tablet Commonly known as:  TENORMIN Take 50 mg by mouth daily.   BREO ELLIPTA 100-25 MCG/INH Aepb Generic drug:  fluticasone furoate-vilanterol Inhale 1 puff into the lungs daily.   busPIRone 15 MG tablet Commonly known as:  BUSPAR Take 15 mg by mouth 3 (three) times daily.   clonazePAM 1 MG tablet Commonly known as:  KLONOPIN Take 1 mg by mouth 3 (three) times daily as needed for anxiety.   doxycycline 100 MG capsule Commonly known as:  VIBRAMYCIN Take 1 capsule (100 mg total) by mouth 2 (two) times daily for 3 days.   gabapentin 600 MG tablet Commonly known as:  NEURONTIN Take 1 tablet (600 mg total) by mouth 3 (three) times daily. What changed:  when to take this   guaiFENesin 600 MG 12 hr tablet Commonly known as:  MUCINEX Take 2 tablets (1,200 mg total) by mouth 2 (two) times daily for 3 days.   levothyroxine 88 MCG tablet Commonly known as:  SYNTHROID, LEVOTHROID Take 88 mcg by mouth daily.   oseltamivir 30 MG capsule Commonly known as:  TAMIFLU Take 1 capsule (30 mg total) by mouth 2 (two) times daily for 1 day.   sertraline 100 MG tablet Commonly known as:  ZOLOFT Take 100 mg by mouth daily.   traMADol 50 MG tablet Commonly known as:  ULTRAM Take 50 mg by mouth 2 (two) times daily.   Vitamin D (Ergocalciferol) 1.25 MG (50000 UT) Caps capsule Commonly known as:  DRISDOL Take 50,000 Units by mouth every 7 (seven) days.       Discharge Assessment: Vitals:   08/28/18 0509 08/28/18 0735  BP: 132/80   Pulse: 89   Resp: 18    Temp: 98.5 F (36.9 C)   SpO2: 90% 94%   Skin clean, dry and intact without evidence of skin break down, no evidence of skin tears noted. IV catheter discontinued intact. Site without signs and symptoms of complications - no redness or edema noted at insertion site, patient denies c/o pain - only slight tenderness at site.  Dressing with slight pressure applied.  D/c Instructions-Education: Discharge instructions given to patient/family with verbalized understanding. D/c education completed with patient/family including follow up instructions, medication list, d/c activities limitations if indicated, with other d/c instructions as indicated by MD - patient able to verbalize understanding, all questions fully answered. Patient instructed to return to ED, call 911, or call MD for any changes in condition.  Patient escorted via WC, and D/C home via private auto.  Andria Rhein, RN 08/28/2018 12:59 PM

## 2018-08-30 LAB — CULTURE, BLOOD (ROUTINE X 2)
Culture: NO GROWTH
Culture: NO GROWTH
Special Requests: ADEQUATE

## 2019-05-28 ENCOUNTER — Other Ambulatory Visit (HOSPITAL_COMMUNITY): Payer: Self-pay | Admitting: Internal Medicine

## 2019-05-28 DIAGNOSIS — Z1231 Encounter for screening mammogram for malignant neoplasm of breast: Secondary | ICD-10-CM

## 2019-06-12 ENCOUNTER — Ambulatory Visit (HOSPITAL_COMMUNITY): Payer: Medicare Other

## 2019-06-14 ENCOUNTER — Encounter (HOSPITAL_COMMUNITY): Payer: Self-pay

## 2019-06-14 ENCOUNTER — Other Ambulatory Visit: Payer: Self-pay

## 2019-06-14 ENCOUNTER — Emergency Department (HOSPITAL_COMMUNITY): Payer: Medicare Other

## 2019-06-14 ENCOUNTER — Emergency Department (HOSPITAL_COMMUNITY)
Admission: EM | Admit: 2019-06-14 | Discharge: 2019-06-14 | Disposition: A | Discharge status: Home / Self Care | Payer: Medicare Other | Attending: Emergency Medicine | Admitting: Emergency Medicine

## 2019-06-14 DIAGNOSIS — F17210 Nicotine dependence, cigarettes, uncomplicated: Secondary | ICD-10-CM | POA: Insufficient documentation

## 2019-06-14 DIAGNOSIS — Y92410 Unspecified street and highway as the place of occurrence of the external cause: Secondary | ICD-10-CM | POA: Insufficient documentation

## 2019-06-14 DIAGNOSIS — E039 Hypothyroidism, unspecified: Secondary | ICD-10-CM | POA: Insufficient documentation

## 2019-06-14 DIAGNOSIS — S20219A Contusion of unspecified front wall of thorax, initial encounter: Secondary | ICD-10-CM | POA: Insufficient documentation

## 2019-06-14 DIAGNOSIS — S022XXA Fracture of nasal bones, initial encounter for closed fracture: Secondary | ICD-10-CM | POA: Diagnosis not present

## 2019-06-14 DIAGNOSIS — S0993XA Unspecified injury of face, initial encounter: Secondary | ICD-10-CM | POA: Diagnosis present

## 2019-06-14 DIAGNOSIS — Z79899 Other long term (current) drug therapy: Secondary | ICD-10-CM | POA: Diagnosis not present

## 2019-06-14 DIAGNOSIS — J449 Chronic obstructive pulmonary disease, unspecified: Secondary | ICD-10-CM | POA: Insufficient documentation

## 2019-06-14 DIAGNOSIS — Y999 Unspecified external cause status: Secondary | ICD-10-CM | POA: Diagnosis not present

## 2019-06-14 DIAGNOSIS — Y9389 Activity, other specified: Secondary | ICD-10-CM | POA: Insufficient documentation

## 2019-06-14 DIAGNOSIS — I10 Essential (primary) hypertension: Secondary | ICD-10-CM | POA: Diagnosis not present

## 2019-06-14 LAB — COMPREHENSIVE METABOLIC PANEL
ALT: 47 U/L — ABNORMAL HIGH (ref 0–44)
AST: 58 U/L — ABNORMAL HIGH (ref 15–41)
Albumin: 4.3 g/dL (ref 3.5–5.0)
Alkaline Phosphatase: 117 U/L (ref 38–126)
Anion gap: 11 (ref 5–15)
BUN: 19 mg/dL (ref 8–23)
CO2: 30 mmol/L (ref 22–32)
Calcium: 9.1 mg/dL (ref 8.9–10.3)
Chloride: 96 mmol/L — ABNORMAL LOW (ref 98–111)
Creatinine, Ser: 0.97 mg/dL (ref 0.44–1.00)
GFR calc Af Amer: >60 mL/min (ref >60)
GFR calc non Af Amer: >60 mL/min (ref >60)
Glucose, Bld: 105 mg/dL — ABNORMAL HIGH (ref 70–99)
Potassium: 3.4 mmol/L — ABNORMAL LOW (ref 3.5–5.1)
Sodium: 137 mmol/L (ref 135–145)
Total Bilirubin: 0.4 mg/dL (ref 0.3–1.2)
Total Protein: 7.8 g/dL (ref 6.5–8.1)

## 2019-06-14 LAB — CBC
HCT: 38.6 % (ref 36.0–46.0)
Hemoglobin: 12.4 g/dL (ref 12.0–15.0)
MCH: 33.8 pg (ref 26.0–34.0)
MCHC: 32.1 g/dL (ref 30.0–36.0)
MCV: 105.2 fL — ABNORMAL HIGH (ref 80.0–100.0)
Platelets: 275 10*3/uL (ref 150–400)
RBC: 3.67 MIL/uL — ABNORMAL LOW (ref 3.87–5.11)
RDW: 15.7 % — ABNORMAL HIGH (ref 11.5–15.5)
WBC: 12.7 10*3/uL — ABNORMAL HIGH (ref 4.0–10.5)
nRBC: 0.2 % (ref 0.0–0.2)

## 2019-06-14 LAB — URINALYSIS, ROUTINE W REFLEX MICROSCOPIC
Bilirubin Urine: NEGATIVE
Glucose, UA: NEGATIVE mg/dL
Hgb urine dipstick: NEGATIVE
Ketones, ur: NEGATIVE mg/dL
Leukocytes,Ua: NEGATIVE
Nitrite: POSITIVE — AB
Protein, ur: NEGATIVE mg/dL
Specific Gravity, Urine: 1.038 — ABNORMAL HIGH (ref 1.005–1.030)
pH: 5 (ref 5.0–8.0)

## 2019-06-14 LAB — ETHANOL: Alcohol, Ethyl (B): <10 mg/dL (ref <10)

## 2019-06-14 LAB — PROTIME-INR
INR: 1 (ref 0.8–1.2)
Prothrombin Time: 12.5 seconds (ref 11.4–15.2)

## 2019-06-14 LAB — LACTIC ACID, PLASMA: Lactic Acid, Venous: 1.3 mmol/L (ref 0.5–1.9)

## 2019-06-14 MED ORDER — CEFAZOLIN SODIUM-DEXTROSE 1-4 GM/50ML-% IV SOLN
1.0000 g | Freq: Once | INTRAVENOUS | Status: AC
  Administered 2019-06-14: 1 g via INTRAVENOUS
  Filled 2019-06-14: qty 50

## 2019-06-14 MED ORDER — SODIUM CHLORIDE 0.9 % IV BOLUS
125.0000 mL | Freq: Once | INTRAVENOUS | Status: AC
  Administered 2019-06-14: 125 mL via INTRAVENOUS

## 2019-06-14 MED ORDER — OXYMETAZOLINE HCL 0.05 % NA SOLN
1.0000 | Freq: Once | NASAL | Status: AC
  Administered 2019-06-14: 1 via NASAL
  Filled 2019-06-14: qty 30

## 2019-06-14 MED ORDER — FENTANYL CITRATE (PF) 100 MCG/2ML IJ SOLN
50.0000 ug | Freq: Once | INTRAMUSCULAR | Status: AC
  Administered 2019-06-14: 50 ug via INTRAVENOUS
  Filled 2019-06-14: qty 2

## 2019-06-14 MED ORDER — IOHEXOL 300 MG/ML  SOLN
100.0000 mL | Freq: Once | INTRAMUSCULAR | Status: AC | PRN
  Administered 2019-06-14: 100 mL via INTRAVENOUS
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
## 2019-06-14 NOTE — Discharge Instructions (Signed)
You were evaluated in the Emergency Department and after careful evaluation, we did not find any emergent condition requiring admission or further testing in the hospital.  Your exam/testing today is overall reassuring.  Please use Tylenol at home for pain.  If you have concerns about how your nose is healing, you can follow-up with the ear nose and throat specialist.  Please return to the Emergency Department if you experience any worsening of your condition.  We encourage you to follow up with a primary care provider.  Thank you for allowing [place] to be a part of your care.
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
## 2019-06-27 ENCOUNTER — Encounter (INDEPENDENT_AMBULATORY_CARE_PROVIDER_SITE_OTHER): Payer: Self-pay | Admitting: Otolaryngology

## 2019-06-27 ENCOUNTER — Other Ambulatory Visit: Payer: Self-pay

## 2019-06-27 ENCOUNTER — Ambulatory Visit (INDEPENDENT_AMBULATORY_CARE_PROVIDER_SITE_OTHER): Payer: Medicare Other | Admitting: Otolaryngology

## 2019-06-27 VITALS — Temp 97.7°F

## 2019-06-27 DIAGNOSIS — S022XXA Fracture of nasal bones, initial encounter for closed fracture: Secondary | ICD-10-CM
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
## 2019-07-22 DIAGNOSIS — J449 Chronic obstructive pulmonary disease, unspecified: Secondary | ICD-10-CM | POA: Diagnosis not present

## 2019-07-22 DIAGNOSIS — M159 Polyosteoarthritis, unspecified: Secondary | ICD-10-CM | POA: Diagnosis not present

## 2019-07-31 DIAGNOSIS — Z299 Encounter for prophylactic measures, unspecified: Secondary | ICD-10-CM | POA: Diagnosis not present

## 2019-07-31 DIAGNOSIS — F17210 Nicotine dependence, cigarettes, uncomplicated: Secondary | ICD-10-CM | POA: Diagnosis not present

## 2019-07-31 DIAGNOSIS — I10 Essential (primary) hypertension: Secondary | ICD-10-CM | POA: Diagnosis not present

## 2019-07-31 DIAGNOSIS — Z6822 Body mass index (BMI) 22.0-22.9, adult: Secondary | ICD-10-CM | POA: Diagnosis not present

## 2019-07-31 DIAGNOSIS — J449 Chronic obstructive pulmonary disease, unspecified: Secondary | ICD-10-CM | POA: Diagnosis not present

## 2019-07-31 DIAGNOSIS — G8929 Other chronic pain: Secondary | ICD-10-CM | POA: Diagnosis not present

## 2019-08-04 DIAGNOSIS — N2889 Other specified disorders of kidney and ureter: Secondary | ICD-10-CM | POA: Diagnosis not present

## 2019-08-04 DIAGNOSIS — G629 Polyneuropathy, unspecified: Secondary | ICD-10-CM | POA: Diagnosis not present

## 2019-08-04 DIAGNOSIS — R531 Weakness: Secondary | ICD-10-CM | POA: Diagnosis not present

## 2019-08-04 DIAGNOSIS — D72829 Elevated white blood cell count, unspecified: Secondary | ICD-10-CM | POA: Diagnosis not present

## 2019-08-04 DIAGNOSIS — Z20822 Contact with and (suspected) exposure to covid-19: Secondary | ICD-10-CM | POA: Diagnosis not present

## 2019-08-04 DIAGNOSIS — R509 Fever, unspecified: Secondary | ICD-10-CM | POA: Diagnosis not present

## 2019-08-04 DIAGNOSIS — E039 Hypothyroidism, unspecified: Secondary | ICD-10-CM | POA: Diagnosis not present

## 2019-08-04 DIAGNOSIS — E871 Hypo-osmolality and hyponatremia: Secondary | ICD-10-CM | POA: Diagnosis not present

## 2019-08-04 DIAGNOSIS — R52 Pain, unspecified: Secondary | ICD-10-CM | POA: Diagnosis not present

## 2019-08-04 DIAGNOSIS — N390 Urinary tract infection, site not specified: Secondary | ICD-10-CM | POA: Diagnosis not present

## 2019-08-04 DIAGNOSIS — R05 Cough: Secondary | ICD-10-CM | POA: Diagnosis not present

## 2019-08-04 DIAGNOSIS — R5383 Other fatigue: Secondary | ICD-10-CM | POA: Diagnosis not present

## 2019-09-11 DIAGNOSIS — Z23 Encounter for immunization: Secondary | ICD-10-CM | POA: Diagnosis not present

## 2019-10-28 DIAGNOSIS — Z0000 Encounter for general adult medical examination without abnormal findings: Secondary | ICD-10-CM | POA: Diagnosis not present

## 2019-10-28 DIAGNOSIS — Z6823 Body mass index (BMI) 23.0-23.9, adult: Secondary | ICD-10-CM | POA: Diagnosis not present

## 2019-10-28 DIAGNOSIS — Z7189 Other specified counseling: Secondary | ICD-10-CM | POA: Diagnosis not present

## 2019-10-28 DIAGNOSIS — Z1211 Encounter for screening for malignant neoplasm of colon: Secondary | ICD-10-CM | POA: Diagnosis not present

## 2019-10-28 DIAGNOSIS — Z299 Encounter for prophylactic measures, unspecified: Secondary | ICD-10-CM | POA: Diagnosis not present

## 2019-10-28 DIAGNOSIS — M549 Dorsalgia, unspecified: Secondary | ICD-10-CM | POA: Diagnosis not present

## 2019-10-28 DIAGNOSIS — I10 Essential (primary) hypertension: Secondary | ICD-10-CM | POA: Diagnosis not present

## 2019-10-28 DIAGNOSIS — H6121 Impacted cerumen, right ear: Secondary | ICD-10-CM | POA: Diagnosis not present

## 2019-11-24 DIAGNOSIS — M159 Polyosteoarthritis, unspecified: Secondary | ICD-10-CM | POA: Diagnosis not present

## 2019-11-24 DIAGNOSIS — J449 Chronic obstructive pulmonary disease, unspecified: Secondary | ICD-10-CM | POA: Diagnosis not present

## 2019-12-25 DIAGNOSIS — M159 Polyosteoarthritis, unspecified: Secondary | ICD-10-CM | POA: Diagnosis not present

## 2019-12-25 DIAGNOSIS — J449 Chronic obstructive pulmonary disease, unspecified: Secondary | ICD-10-CM | POA: Diagnosis not present

## 2020-01-21 DIAGNOSIS — J019 Acute sinusitis, unspecified: Secondary | ICD-10-CM | POA: Diagnosis not present

## 2020-01-21 DIAGNOSIS — J209 Acute bronchitis, unspecified: Secondary | ICD-10-CM | POA: Diagnosis not present

## 2020-01-21 DIAGNOSIS — J029 Acute pharyngitis, unspecified: Secondary | ICD-10-CM | POA: Diagnosis not present

## 2020-01-21 DIAGNOSIS — R05 Cough: Secondary | ICD-10-CM | POA: Diagnosis not present

## 2020-01-24 DIAGNOSIS — M159 Polyosteoarthritis, unspecified: Secondary | ICD-10-CM | POA: Diagnosis not present

## 2020-01-24 DIAGNOSIS — J449 Chronic obstructive pulmonary disease, unspecified: Secondary | ICD-10-CM | POA: Diagnosis not present

## 2020-02-06 DIAGNOSIS — J449 Chronic obstructive pulmonary disease, unspecified: Secondary | ICD-10-CM | POA: Diagnosis not present

## 2020-02-06 DIAGNOSIS — M159 Polyosteoarthritis, unspecified: Secondary | ICD-10-CM | POA: Diagnosis not present

## 2020-02-20 DIAGNOSIS — Z299 Encounter for prophylactic measures, unspecified: Secondary | ICD-10-CM | POA: Diagnosis not present

## 2020-02-20 DIAGNOSIS — R1031 Right lower quadrant pain: Secondary | ICD-10-CM | POA: Diagnosis not present

## 2020-02-20 DIAGNOSIS — E039 Hypothyroidism, unspecified: Secondary | ICD-10-CM | POA: Diagnosis not present

## 2020-02-20 DIAGNOSIS — R1032 Left lower quadrant pain: Secondary | ICD-10-CM | POA: Diagnosis not present

## 2020-02-20 DIAGNOSIS — I10 Essential (primary) hypertension: Secondary | ICD-10-CM | POA: Diagnosis not present

## 2020-03-26 DIAGNOSIS — J449 Chronic obstructive pulmonary disease, unspecified: Secondary | ICD-10-CM | POA: Diagnosis not present

## 2020-03-26 DIAGNOSIS — M159 Polyosteoarthritis, unspecified: Secondary | ICD-10-CM | POA: Diagnosis not present

## 2020-04-24 DIAGNOSIS — J449 Chronic obstructive pulmonary disease, unspecified: Secondary | ICD-10-CM | POA: Diagnosis not present

## 2020-04-24 DIAGNOSIS — M159 Polyosteoarthritis, unspecified: Secondary | ICD-10-CM | POA: Diagnosis not present

## 2020-05-05 DIAGNOSIS — Z1231 Encounter for screening mammogram for malignant neoplasm of breast: Secondary | ICD-10-CM | POA: Diagnosis not present

## 2020-05-26 DIAGNOSIS — J449 Chronic obstructive pulmonary disease, unspecified: Secondary | ICD-10-CM | POA: Diagnosis not present

## 2020-05-26 DIAGNOSIS — M159 Polyosteoarthritis, unspecified: Secondary | ICD-10-CM | POA: Diagnosis not present
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
## 2020-06-25 DIAGNOSIS — Z299 Encounter for prophylactic measures, unspecified: Secondary | ICD-10-CM | POA: Diagnosis not present

## 2020-06-25 DIAGNOSIS — F17210 Nicotine dependence, cigarettes, uncomplicated: Secondary | ICD-10-CM | POA: Diagnosis not present

## 2020-06-25 DIAGNOSIS — J449 Chronic obstructive pulmonary disease, unspecified: Secondary | ICD-10-CM | POA: Diagnosis not present

## 2020-06-25 DIAGNOSIS — M159 Polyosteoarthritis, unspecified: Secondary | ICD-10-CM | POA: Diagnosis not present

## 2020-06-25 DIAGNOSIS — I10 Essential (primary) hypertension: Secondary | ICD-10-CM | POA: Diagnosis not present

## 2020-08-21 DIAGNOSIS — F17210 Nicotine dependence, cigarettes, uncomplicated: Secondary | ICD-10-CM | POA: Diagnosis not present

## 2020-08-21 DIAGNOSIS — Z299 Encounter for prophylactic measures, unspecified: Secondary | ICD-10-CM | POA: Diagnosis not present

## 2020-08-21 DIAGNOSIS — I10 Essential (primary) hypertension: Secondary | ICD-10-CM | POA: Diagnosis not present

## 2020-08-21 DIAGNOSIS — M549 Dorsalgia, unspecified: Secondary | ICD-10-CM | POA: Diagnosis not present

## 2020-08-21 DIAGNOSIS — Z6823 Body mass index (BMI) 23.0-23.9, adult: Secondary | ICD-10-CM | POA: Diagnosis not present

## 2020-08-21 DIAGNOSIS — J449 Chronic obstructive pulmonary disease, unspecified: Secondary | ICD-10-CM | POA: Diagnosis not present

## 2020-08-24 DIAGNOSIS — I10 Essential (primary) hypertension: Secondary | ICD-10-CM | POA: Diagnosis not present

## 2020-08-24 DIAGNOSIS — J449 Chronic obstructive pulmonary disease, unspecified: Secondary | ICD-10-CM | POA: Diagnosis not present

## 2020-09-18 DIAGNOSIS — Z299 Encounter for prophylactic measures, unspecified: Secondary | ICD-10-CM | POA: Diagnosis not present

## 2020-09-18 DIAGNOSIS — J449 Chronic obstructive pulmonary disease, unspecified: Secondary | ICD-10-CM | POA: Diagnosis not present

## 2020-09-18 DIAGNOSIS — F17210 Nicotine dependence, cigarettes, uncomplicated: Secondary | ICD-10-CM | POA: Diagnosis not present

## 2020-09-18 DIAGNOSIS — M549 Dorsalgia, unspecified: Secondary | ICD-10-CM | POA: Diagnosis not present

## 2020-09-28 DIAGNOSIS — Z299 Encounter for prophylactic measures, unspecified: Secondary | ICD-10-CM | POA: Diagnosis not present

## 2020-09-28 DIAGNOSIS — I10 Essential (primary) hypertension: Secondary | ICD-10-CM | POA: Diagnosis not present

## 2020-09-28 DIAGNOSIS — M549 Dorsalgia, unspecified: Secondary | ICD-10-CM | POA: Diagnosis not present

## 2020-10-24 DIAGNOSIS — I10 Essential (primary) hypertension: Secondary | ICD-10-CM | POA: Diagnosis not present

## 2020-10-24 DIAGNOSIS — J449 Chronic obstructive pulmonary disease, unspecified: Secondary | ICD-10-CM | POA: Diagnosis not present

## 2020-11-19 DIAGNOSIS — I10 Essential (primary) hypertension: Secondary | ICD-10-CM | POA: Diagnosis not present

## 2020-11-19 DIAGNOSIS — G8929 Other chronic pain: Secondary | ICD-10-CM | POA: Diagnosis not present

## 2020-11-19 DIAGNOSIS — Z299 Encounter for prophylactic measures, unspecified: Secondary | ICD-10-CM | POA: Diagnosis not present

## 2020-12-24 DIAGNOSIS — J449 Chronic obstructive pulmonary disease, unspecified: Secondary | ICD-10-CM | POA: Diagnosis not present

## 2020-12-24 DIAGNOSIS — I10 Essential (primary) hypertension: Secondary | ICD-10-CM | POA: Diagnosis not present

## 2021-02-19 DIAGNOSIS — G8929 Other chronic pain: Secondary | ICD-10-CM | POA: Diagnosis not present

## 2021-02-19 DIAGNOSIS — F17210 Nicotine dependence, cigarettes, uncomplicated: Secondary | ICD-10-CM | POA: Diagnosis not present

## 2021-02-19 DIAGNOSIS — Z299 Encounter for prophylactic measures, unspecified: Secondary | ICD-10-CM | POA: Diagnosis not present

## 2021-02-19 DIAGNOSIS — I10 Essential (primary) hypertension: Secondary | ICD-10-CM | POA: Diagnosis not present

## 2021-02-19 DIAGNOSIS — E039 Hypothyroidism, unspecified: Secondary | ICD-10-CM | POA: Diagnosis not present

## 2021-03-26 DIAGNOSIS — I10 Essential (primary) hypertension: Secondary | ICD-10-CM | POA: Diagnosis not present

## 2021-03-30 DIAGNOSIS — R112 Nausea with vomiting, unspecified: Secondary | ICD-10-CM | POA: Diagnosis not present

## 2021-03-30 DIAGNOSIS — J441 Chronic obstructive pulmonary disease with (acute) exacerbation: Secondary | ICD-10-CM | POA: Diagnosis not present

## 2021-03-30 DIAGNOSIS — F17210 Nicotine dependence, cigarettes, uncomplicated: Secondary | ICD-10-CM | POA: Diagnosis not present

## 2021-03-30 DIAGNOSIS — J449 Chronic obstructive pulmonary disease, unspecified: Secondary | ICD-10-CM | POA: Diagnosis not present

## 2021-03-30 DIAGNOSIS — Z299 Encounter for prophylactic measures, unspecified: Secondary | ICD-10-CM | POA: Diagnosis not present

## 2021-06-08 DIAGNOSIS — Z885 Allergy status to narcotic agent status: Secondary | ICD-10-CM | POA: Diagnosis not present

## 2021-06-08 DIAGNOSIS — Z882 Allergy status to sulfonamides status: Secondary | ICD-10-CM | POA: Diagnosis not present

## 2021-06-08 DIAGNOSIS — Z79899 Other long term (current) drug therapy: Secondary | ICD-10-CM | POA: Diagnosis not present

## 2021-06-08 DIAGNOSIS — U071 COVID-19: Secondary | ICD-10-CM | POA: Diagnosis not present

## 2021-06-08 DIAGNOSIS — Z880 Allergy status to penicillin: Secondary | ICD-10-CM | POA: Diagnosis not present

## 2021-06-08 DIAGNOSIS — E079 Disorder of thyroid, unspecified: Secondary | ICD-10-CM | POA: Diagnosis not present

## 2021-06-11 DIAGNOSIS — R300 Dysuria: Secondary | ICD-10-CM | POA: Diagnosis not present

## 2021-06-11 DIAGNOSIS — Z6823 Body mass index (BMI) 23.0-23.9, adult: Secondary | ICD-10-CM | POA: Diagnosis not present

## 2021-06-11 DIAGNOSIS — U071 COVID-19: Secondary | ICD-10-CM | POA: Diagnosis not present

## 2021-06-11 DIAGNOSIS — I10 Essential (primary) hypertension: Secondary | ICD-10-CM | POA: Diagnosis not present

## 2021-06-11 DIAGNOSIS — Z299 Encounter for prophylactic measures, unspecified: Secondary | ICD-10-CM | POA: Diagnosis not present

## 2021-06-14 DIAGNOSIS — Z1211 Encounter for screening for malignant neoplasm of colon: Secondary | ICD-10-CM | POA: Diagnosis not present

## 2021-06-25 DIAGNOSIS — M8580 Other specified disorders of bone density and structure, unspecified site: Secondary | ICD-10-CM | POA: Diagnosis not present

## 2021-06-25 DIAGNOSIS — J449 Chronic obstructive pulmonary disease, unspecified: Secondary | ICD-10-CM | POA: Diagnosis not present

## 2021-09-23 DIAGNOSIS — M8580 Other specified disorders of bone density and structure, unspecified site: Secondary | ICD-10-CM | POA: Diagnosis not present

## 2021-09-23 DIAGNOSIS — J449 Chronic obstructive pulmonary disease, unspecified: Secondary | ICD-10-CM | POA: Diagnosis not present

## 2021-09-28 DIAGNOSIS — Z1231 Encounter for screening mammogram for malignant neoplasm of breast: Secondary | ICD-10-CM | POA: Diagnosis not present

## 2021-10-06 DIAGNOSIS — R928 Other abnormal and inconclusive findings on diagnostic imaging of breast: Secondary | ICD-10-CM | POA: Diagnosis not present

## 2021-10-06 DIAGNOSIS — R922 Inconclusive mammogram: Secondary | ICD-10-CM | POA: Diagnosis not present

## 2021-10-06 DIAGNOSIS — R921 Mammographic calcification found on diagnostic imaging of breast: Secondary | ICD-10-CM | POA: Diagnosis not present

## 2021-10-22 DIAGNOSIS — I10 Essential (primary) hypertension: Secondary | ICD-10-CM | POA: Diagnosis not present

## 2021-10-22 DIAGNOSIS — Z299 Encounter for prophylactic measures, unspecified: Secondary | ICD-10-CM | POA: Diagnosis not present

## 2021-10-22 DIAGNOSIS — J441 Chronic obstructive pulmonary disease with (acute) exacerbation: Secondary | ICD-10-CM | POA: Diagnosis not present

## 2021-10-27 DIAGNOSIS — F17210 Nicotine dependence, cigarettes, uncomplicated: Secondary | ICD-10-CM | POA: Diagnosis not present

## 2021-10-27 DIAGNOSIS — R5383 Other fatigue: Secondary | ICD-10-CM | POA: Diagnosis not present

## 2021-10-27 DIAGNOSIS — Z0000 Encounter for general adult medical examination without abnormal findings: Secondary | ICD-10-CM | POA: Diagnosis not present

## 2021-10-27 DIAGNOSIS — Z299 Encounter for prophylactic measures, unspecified: Secondary | ICD-10-CM | POA: Diagnosis not present

## 2021-10-27 DIAGNOSIS — Z79899 Other long term (current) drug therapy: Secondary | ICD-10-CM | POA: Diagnosis not present

## 2021-10-27 DIAGNOSIS — Z6823 Body mass index (BMI) 23.0-23.9, adult: Secondary | ICD-10-CM | POA: Diagnosis not present

## 2021-10-27 DIAGNOSIS — I10 Essential (primary) hypertension: Secondary | ICD-10-CM | POA: Diagnosis not present

## 2021-10-27 DIAGNOSIS — E7800 Pure hypercholesterolemia, unspecified: Secondary | ICD-10-CM | POA: Diagnosis not present

## 2021-10-27 DIAGNOSIS — Z7189 Other specified counseling: Secondary | ICD-10-CM | POA: Diagnosis not present

## 2021-12-29 DIAGNOSIS — M25552 Pain in left hip: Secondary | ICD-10-CM | POA: Diagnosis not present

## 2021-12-29 DIAGNOSIS — M1612 Unilateral primary osteoarthritis, left hip: Secondary | ICD-10-CM | POA: Diagnosis not present

## 2022-01-27 DIAGNOSIS — G8929 Other chronic pain: Secondary | ICD-10-CM | POA: Diagnosis not present

## 2022-01-27 DIAGNOSIS — Z299 Encounter for prophylactic measures, unspecified: Secondary | ICD-10-CM | POA: Diagnosis not present

## 2022-01-27 DIAGNOSIS — I10 Essential (primary) hypertension: Secondary | ICD-10-CM | POA: Diagnosis not present

## 2022-01-27 DIAGNOSIS — M7989 Other specified soft tissue disorders: Secondary | ICD-10-CM | POA: Diagnosis not present

## 2022-01-27 DIAGNOSIS — F17210 Nicotine dependence, cigarettes, uncomplicated: Secondary | ICD-10-CM | POA: Diagnosis not present

## 2022-02-11 DIAGNOSIS — I509 Heart failure, unspecified: Secondary | ICD-10-CM | POA: Diagnosis not present

## 2022-02-11 DIAGNOSIS — M25552 Pain in left hip: Secondary | ICD-10-CM | POA: Diagnosis not present

## 2022-02-11 DIAGNOSIS — R609 Edema, unspecified: Secondary | ICD-10-CM | POA: Diagnosis not present

## 2022-02-11 DIAGNOSIS — R0600 Dyspnea, unspecified: Secondary | ICD-10-CM | POA: Diagnosis not present

## 2022-02-11 DIAGNOSIS — M25572 Pain in left ankle and joints of left foot: Secondary | ICD-10-CM | POA: Diagnosis not present

## 2022-02-11 DIAGNOSIS — I10 Essential (primary) hypertension: Secondary | ICD-10-CM | POA: Diagnosis not present

## 2022-02-14 DIAGNOSIS — M79605 Pain in left leg: Secondary | ICD-10-CM | POA: Diagnosis not present

## 2022-02-14 DIAGNOSIS — M25562 Pain in left knee: Secondary | ICD-10-CM | POA: Diagnosis not present

## 2022-02-14 DIAGNOSIS — I509 Heart failure, unspecified: Secondary | ICD-10-CM | POA: Diagnosis not present

## 2022-02-14 DIAGNOSIS — M25572 Pain in left ankle and joints of left foot: Secondary | ICD-10-CM | POA: Diagnosis not present

## 2022-02-16 DIAGNOSIS — M79605 Pain in left leg: Secondary | ICD-10-CM | POA: Diagnosis not present

## 2022-02-16 DIAGNOSIS — R2242 Localized swelling, mass and lump, left lower limb: Secondary | ICD-10-CM | POA: Diagnosis not present

## 2022-02-24 DIAGNOSIS — R600 Localized edema: Secondary | ICD-10-CM | POA: Diagnosis not present

## 2022-02-24 DIAGNOSIS — E039 Hypothyroidism, unspecified: Secondary | ICD-10-CM | POA: Diagnosis not present

## 2022-02-24 DIAGNOSIS — Z6823 Body mass index (BMI) 23.0-23.9, adult: Secondary | ICD-10-CM | POA: Diagnosis not present

## 2022-04-19 DIAGNOSIS — I10 Essential (primary) hypertension: Secondary | ICD-10-CM | POA: Diagnosis not present

## 2022-04-19 DIAGNOSIS — R600 Localized edema: Secondary | ICD-10-CM | POA: Diagnosis not present

## 2022-05-04 DIAGNOSIS — R5383 Other fatigue: Secondary | ICD-10-CM | POA: Diagnosis not present

## 2022-05-04 DIAGNOSIS — E7800 Pure hypercholesterolemia, unspecified: Secondary | ICD-10-CM | POA: Diagnosis not present

## 2022-05-04 DIAGNOSIS — Z0000 Encounter for general adult medical examination without abnormal findings: Secondary | ICD-10-CM | POA: Diagnosis not present

## 2022-05-04 DIAGNOSIS — Z299 Encounter for prophylactic measures, unspecified: Secondary | ICD-10-CM | POA: Diagnosis not present

## 2022-05-04 DIAGNOSIS — Z6822 Body mass index (BMI) 22.0-22.9, adult: Secondary | ICD-10-CM | POA: Diagnosis not present

## 2022-05-04 DIAGNOSIS — I10 Essential (primary) hypertension: Secondary | ICD-10-CM | POA: Diagnosis not present

## 2022-05-04 DIAGNOSIS — Z79899 Other long term (current) drug therapy: Secondary | ICD-10-CM | POA: Diagnosis not present

## 2022-08-15 DIAGNOSIS — I10 Essential (primary) hypertension: Secondary | ICD-10-CM | POA: Diagnosis not present

## 2022-08-15 DIAGNOSIS — L308 Other specified dermatitis: Secondary | ICD-10-CM | POA: Diagnosis not present

## 2022-08-15 DIAGNOSIS — J441 Chronic obstructive pulmonary disease with (acute) exacerbation: Secondary | ICD-10-CM | POA: Diagnosis not present

## 2022-08-15 DIAGNOSIS — B028 Zoster with other complications: Secondary | ICD-10-CM | POA: Diagnosis not present

## 2022-08-15 DIAGNOSIS — Z299 Encounter for prophylactic measures, unspecified: Secondary | ICD-10-CM | POA: Diagnosis not present

## 2022-08-29 DIAGNOSIS — L308 Other specified dermatitis: Secondary | ICD-10-CM | POA: Diagnosis not present

## 2022-08-29 DIAGNOSIS — B028 Zoster with other complications: Secondary | ICD-10-CM | POA: Diagnosis not present

## 2022-08-29 DIAGNOSIS — Z299 Encounter for prophylactic measures, unspecified: Secondary | ICD-10-CM | POA: Diagnosis not present

## 2022-08-29 DIAGNOSIS — N390 Urinary tract infection, site not specified: Secondary | ICD-10-CM | POA: Diagnosis not present

## 2022-08-29 DIAGNOSIS — I10 Essential (primary) hypertension: Secondary | ICD-10-CM | POA: Diagnosis not present

## 2022-09-17 DIAGNOSIS — S2020XA Contusion of thorax, unspecified, initial encounter: Secondary | ICD-10-CM | POA: Diagnosis not present

## 2022-09-17 DIAGNOSIS — J810 Acute pulmonary edema: Secondary | ICD-10-CM | POA: Diagnosis not present

## 2022-09-17 DIAGNOSIS — I10 Essential (primary) hypertension: Secondary | ICD-10-CM | POA: Diagnosis not present

## 2022-09-19 DIAGNOSIS — J810 Acute pulmonary edema: Secondary | ICD-10-CM | POA: Diagnosis not present

## 2022-09-19 DIAGNOSIS — I10 Essential (primary) hypertension: Secondary | ICD-10-CM | POA: Diagnosis not present

## 2022-09-19 DIAGNOSIS — S2020XD Contusion of thorax, unspecified, subsequent encounter: Secondary | ICD-10-CM | POA: Diagnosis not present

## 2022-10-20 DIAGNOSIS — H40033 Anatomical narrow angle, bilateral: Secondary | ICD-10-CM | POA: Diagnosis not present

## 2022-10-20 DIAGNOSIS — L84 Corns and callosities: Secondary | ICD-10-CM | POA: Diagnosis not present

## 2022-10-20 DIAGNOSIS — M79676 Pain in unspecified toe(s): Secondary | ICD-10-CM | POA: Diagnosis not present

## 2022-10-20 DIAGNOSIS — H2513 Age-related nuclear cataract, bilateral: Secondary | ICD-10-CM | POA: Diagnosis not present

## 2022-10-20 DIAGNOSIS — B351 Tinea unguium: Secondary | ICD-10-CM | POA: Diagnosis not present

## 2022-10-20 DIAGNOSIS — I70203 Unspecified atherosclerosis of native arteries of extremities, bilateral legs: Secondary | ICD-10-CM | POA: Diagnosis not present

## 2022-10-27 DIAGNOSIS — I10 Essential (primary) hypertension: Secondary | ICD-10-CM | POA: Diagnosis not present

## 2022-10-27 DIAGNOSIS — Z299 Encounter for prophylactic measures, unspecified: Secondary | ICD-10-CM | POA: Diagnosis not present

## 2022-10-27 DIAGNOSIS — R609 Edema, unspecified: Secondary | ICD-10-CM | POA: Diagnosis not present

## 2022-10-27 DIAGNOSIS — G8929 Other chronic pain: Secondary | ICD-10-CM | POA: Diagnosis not present

## 2023-01-12 DIAGNOSIS — Z1231 Encounter for screening mammogram for malignant neoplasm of breast: Secondary | ICD-10-CM | POA: Diagnosis not present

## 2023-01-24 DIAGNOSIS — L84 Corns and callosities: Secondary | ICD-10-CM | POA: Diagnosis not present

## 2023-01-24 DIAGNOSIS — I70203 Unspecified atherosclerosis of native arteries of extremities, bilateral legs: Secondary | ICD-10-CM | POA: Diagnosis not present

## 2023-01-24 DIAGNOSIS — B351 Tinea unguium: Secondary | ICD-10-CM | POA: Diagnosis not present

## 2023-01-24 DIAGNOSIS — M79676 Pain in unspecified toe(s): Secondary | ICD-10-CM | POA: Diagnosis not present

## 2023-01-30 ENCOUNTER — Telehealth: Payer: Self-pay | Admitting: *Deleted

## 2023-01-30 NOTE — Patient Outreach (Signed)
  Care Coordination   01/30/2023 Name: Melissa Odom MRN: [id number] DOB: Dec 20, 1950   Care Coordination Outreach Attempts:  An unsuccessful telephone outreach was attempted today to offer the patient information about available care coordination services.  Follow Up Plan:  Additional outreach attempts will be made to offer the patient care coordination information and services.   Encounter Outcome:  No Answer   Care Coordination Interventions:  No, not indicated    Demetrios Loll, BSN, RN-BC RN Care Coordinator [facility]  [facility] Direct Dial: [phone] Main #: [phone]

## 2023-02-07 ENCOUNTER — Telehealth: Payer: Self-pay | Admitting: *Deleted

## 2023-02-07 ENCOUNTER — Encounter: Payer: Self-pay | Admitting: *Deleted

## 2023-02-07 NOTE — Patient Outreach (Signed)
  Care Coordination   02/07/2023 Name: Melissa Odom MRN: [id number] DOB: [birth year]   Care Coordination Outreach Attempts:  A second unsuccessful outreach was attempted today to offer the patient with information about available care coordination services.  Follow Up Plan:  Additional outreach attempts will be made to offer the patient care coordination information and services.   Encounter Outcome:  No Answer   Care Coordination Interventions:  No, not indicated    Demetrios Loll, BSN, RN-BC RN Care Coordinator [facility]  [facility] Direct Dial: [phone] Main #: [phone]

## 2023-03-17 DIAGNOSIS — I10 Essential (primary) hypertension: Secondary | ICD-10-CM | POA: Diagnosis not present

## 2023-03-17 DIAGNOSIS — G8929 Other chronic pain: Secondary | ICD-10-CM | POA: Diagnosis not present

## 2023-03-17 DIAGNOSIS — Z23 Encounter for immunization: Secondary | ICD-10-CM | POA: Diagnosis not present

## 2023-03-17 DIAGNOSIS — Z299 Encounter for prophylactic measures, unspecified: Secondary | ICD-10-CM | POA: Diagnosis not present

## 2023-03-17 DIAGNOSIS — R52 Pain, unspecified: Secondary | ICD-10-CM | POA: Diagnosis not present

## 2023-03-17 DIAGNOSIS — R0981 Nasal congestion: Secondary | ICD-10-CM | POA: Diagnosis not present

## 2023-06-13 DIAGNOSIS — J329 Chronic sinusitis, unspecified: Secondary | ICD-10-CM | POA: Diagnosis not present

## 2023-06-13 DIAGNOSIS — G8929 Other chronic pain: Secondary | ICD-10-CM | POA: Diagnosis not present

## 2023-06-13 DIAGNOSIS — K130 Diseases of lips: Secondary | ICD-10-CM | POA: Diagnosis not present

## 2023-06-13 DIAGNOSIS — Z299 Encounter for prophylactic measures, unspecified: Secondary | ICD-10-CM | POA: Diagnosis not present

## 2023-06-26 DIAGNOSIS — Z79899 Other long term (current) drug therapy: Secondary | ICD-10-CM | POA: Diagnosis not present

## 2023-06-26 DIAGNOSIS — E039 Hypothyroidism, unspecified: Secondary | ICD-10-CM | POA: Diagnosis not present

## 2023-06-26 DIAGNOSIS — R5383 Other fatigue: Secondary | ICD-10-CM | POA: Diagnosis not present

## 2023-06-26 DIAGNOSIS — E7800 Pure hypercholesterolemia, unspecified: Secondary | ICD-10-CM | POA: Diagnosis not present

## 2023-07-07 DIAGNOSIS — Z299 Encounter for prophylactic measures, unspecified: Secondary | ICD-10-CM | POA: Diagnosis not present

## 2023-07-07 DIAGNOSIS — I10 Essential (primary) hypertension: Secondary | ICD-10-CM | POA: Diagnosis not present

## 2023-07-07 DIAGNOSIS — J441 Chronic obstructive pulmonary disease with (acute) exacerbation: Secondary | ICD-10-CM | POA: Diagnosis not present

## 2023-07-14 DIAGNOSIS — M549 Dorsalgia, unspecified: Secondary | ICD-10-CM | POA: Diagnosis not present

## 2023-07-14 DIAGNOSIS — H6120 Impacted cerumen, unspecified ear: Secondary | ICD-10-CM | POA: Diagnosis not present

## 2023-07-14 DIAGNOSIS — I10 Essential (primary) hypertension: Secondary | ICD-10-CM | POA: Diagnosis not present

## 2023-07-14 DIAGNOSIS — J449 Chronic obstructive pulmonary disease, unspecified: Secondary | ICD-10-CM | POA: Diagnosis not present

## 2023-07-14 DIAGNOSIS — Z299 Encounter for prophylactic measures, unspecified: Secondary | ICD-10-CM | POA: Diagnosis not present

## 2023-07-24 DIAGNOSIS — I10 Essential (primary) hypertension: Secondary | ICD-10-CM | POA: Diagnosis not present

## 2023-07-24 DIAGNOSIS — R350 Frequency of micturition: Secondary | ICD-10-CM | POA: Diagnosis not present

## 2023-07-24 DIAGNOSIS — F17210 Nicotine dependence, cigarettes, uncomplicated: Secondary | ICD-10-CM | POA: Diagnosis not present

## 2023-07-24 DIAGNOSIS — E7800 Pure hypercholesterolemia, unspecified: Secondary | ICD-10-CM | POA: Diagnosis not present

## 2023-07-24 DIAGNOSIS — Z0000 Encounter for general adult medical examination without abnormal findings: Secondary | ICD-10-CM | POA: Diagnosis not present

## 2023-07-24 DIAGNOSIS — Z79899 Other long term (current) drug therapy: Secondary | ICD-10-CM | POA: Diagnosis not present

## 2023-07-24 DIAGNOSIS — Z299 Encounter for prophylactic measures, unspecified: Secondary | ICD-10-CM | POA: Diagnosis not present

## 2023-07-24 DIAGNOSIS — Z7189 Other specified counseling: Secondary | ICD-10-CM | POA: Diagnosis not present

## 2023-07-24 DIAGNOSIS — R5383 Other fatigue: Secondary | ICD-10-CM | POA: Diagnosis not present

## 2023-11-01 DIAGNOSIS — Z299 Encounter for prophylactic measures, unspecified: Secondary | ICD-10-CM | POA: Diagnosis not present

## 2023-11-01 DIAGNOSIS — M10021 Idiopathic gout, right elbow: Secondary | ICD-10-CM | POA: Diagnosis not present

## 2023-11-01 DIAGNOSIS — R059 Cough, unspecified: Secondary | ICD-10-CM | POA: Diagnosis not present

## 2023-11-01 DIAGNOSIS — J069 Acute upper respiratory infection, unspecified: Secondary | ICD-10-CM | POA: Diagnosis not present

## 2023-11-01 DIAGNOSIS — R21 Rash and other nonspecific skin eruption: Secondary | ICD-10-CM | POA: Diagnosis not present

## 2023-12-12 DIAGNOSIS — G4700 Insomnia, unspecified: Secondary | ICD-10-CM | POA: Diagnosis not present

## 2023-12-12 DIAGNOSIS — L089 Local infection of the skin and subcutaneous tissue, unspecified: Secondary | ICD-10-CM | POA: Diagnosis not present

## 2023-12-12 DIAGNOSIS — I10 Essential (primary) hypertension: Secondary | ICD-10-CM | POA: Diagnosis not present

## 2023-12-12 DIAGNOSIS — M109 Gout, unspecified: Secondary | ICD-10-CM | POA: Diagnosis not present

## 2023-12-12 DIAGNOSIS — J441 Chronic obstructive pulmonary disease with (acute) exacerbation: Secondary | ICD-10-CM | POA: Diagnosis not present

## 2023-12-12 DIAGNOSIS — Z299 Encounter for prophylactic measures, unspecified: Secondary | ICD-10-CM | POA: Diagnosis not present

## 2023-12-12 DIAGNOSIS — R109 Unspecified abdominal pain: Secondary | ICD-10-CM | POA: Diagnosis not present

## 2023-12-13 DIAGNOSIS — S50311A Abrasion of right elbow, initial encounter: Secondary | ICD-10-CM | POA: Diagnosis not present

## 2024-01-11 DIAGNOSIS — R1030 Lower abdominal pain, unspecified: Secondary | ICD-10-CM | POA: Diagnosis not present

## 2024-01-11 DIAGNOSIS — L03113 Cellulitis of right upper limb: Secondary | ICD-10-CM | POA: Diagnosis not present

## 2024-01-11 DIAGNOSIS — I10 Essential (primary) hypertension: Secondary | ICD-10-CM | POA: Diagnosis not present

## 2024-01-11 DIAGNOSIS — Z299 Encounter for prophylactic measures, unspecified: Secondary | ICD-10-CM | POA: Diagnosis not present

## 2024-01-15 DIAGNOSIS — M25521 Pain in right elbow: Secondary | ICD-10-CM | POA: Diagnosis not present

## 2024-01-15 DIAGNOSIS — L03113 Cellulitis of right upper limb: Secondary | ICD-10-CM | POA: Diagnosis not present

## 2024-01-15 DIAGNOSIS — L0390 Cellulitis, unspecified: Secondary | ICD-10-CM | POA: Diagnosis not present

## 2024-01-15 DIAGNOSIS — M7989 Other specified soft tissue disorders: Secondary | ICD-10-CM | POA: Diagnosis not present

## 2024-01-18 DIAGNOSIS — I10 Essential (primary) hypertension: Secondary | ICD-10-CM | POA: Diagnosis not present

## 2024-01-18 DIAGNOSIS — Z299 Encounter for prophylactic measures, unspecified: Secondary | ICD-10-CM | POA: Diagnosis not present

## 2024-01-18 DIAGNOSIS — L03113 Cellulitis of right upper limb: Secondary | ICD-10-CM | POA: Diagnosis not present

## 2024-01-24 DIAGNOSIS — Z1231 Encounter for screening mammogram for malignant neoplasm of breast: Secondary | ICD-10-CM | POA: Diagnosis not present

## 2024-04-15 DIAGNOSIS — R5383 Other fatigue: Secondary | ICD-10-CM | POA: Diagnosis not present

## 2024-04-15 DIAGNOSIS — F17210 Nicotine dependence, cigarettes, uncomplicated: Secondary | ICD-10-CM | POA: Diagnosis not present

## 2024-04-15 DIAGNOSIS — Z299 Encounter for prophylactic measures, unspecified: Secondary | ICD-10-CM | POA: Diagnosis not present

## 2024-04-15 DIAGNOSIS — Z23 Encounter for immunization: Secondary | ICD-10-CM | POA: Diagnosis not present

## 2024-04-15 DIAGNOSIS — Z0000 Encounter for general adult medical examination without abnormal findings: Secondary | ICD-10-CM | POA: Diagnosis not present

## 2024-04-15 DIAGNOSIS — R52 Pain, unspecified: Secondary | ICD-10-CM | POA: Diagnosis not present

## 2024-04-15 DIAGNOSIS — Z79899 Other long term (current) drug therapy: Secondary | ICD-10-CM | POA: Diagnosis not present

## 2024-04-15 DIAGNOSIS — E7800 Pure hypercholesterolemia, unspecified: Secondary | ICD-10-CM | POA: Diagnosis not present

## 2024-04-15 DIAGNOSIS — I10 Essential (primary) hypertension: Secondary | ICD-10-CM | POA: Diagnosis not present

## 2024-04-15 DIAGNOSIS — J449 Chronic obstructive pulmonary disease, unspecified: Secondary | ICD-10-CM | POA: Diagnosis not present

## 2024-06-24 ENCOUNTER — Telehealth: Payer: Self-pay

## 2024-06-24 NOTE — Telephone Encounter (Signed)
 Copied from CRM #[id number]. Topic: Referral - Pulmonology Self-Referral >> Jun 24, 2024  9:12 AM Melissa Odom ORN wrote: This patient has scheduled a self-referred appointment. See attached appointment information and form for details.   Routing to [employer].

## 2024-06-24 NOTE — Telephone Encounter (Signed)
 Admin and Pt Access Specialist made aware of incoming referral from [facility] to attach to visit.  Patient is set for HFU/New pt visit on 07/10/2024 w/ Dr. Pleas. Encounter notes are also found in Care Everywhere.   Nothing further needed at this time.

## 2024-07-10 ENCOUNTER — Ambulatory Visit

## 2024-07-10 VITALS — BP 124/84 | HR 82 | Temp 97.9°F | Ht 61.0 in | Wt 132.8 lb

## 2024-07-10 DIAGNOSIS — R600 Localized edema: Secondary | ICD-10-CM | POA: Diagnosis not present

## 2024-07-10 DIAGNOSIS — Z87891 Personal history of nicotine dependence: Secondary | ICD-10-CM | POA: Diagnosis not present

## 2024-07-10 DIAGNOSIS — J439 Emphysema, unspecified: Secondary | ICD-10-CM

## 2024-07-10 DIAGNOSIS — I2510 Atherosclerotic heart disease of native coronary artery without angina pectoris: Secondary | ICD-10-CM

## 2024-07-10 DIAGNOSIS — J9611 Chronic respiratory failure with hypoxia: Secondary | ICD-10-CM | POA: Diagnosis not present

## 2024-07-10 DIAGNOSIS — Z09 Encounter for follow-up examination after completed treatment for conditions other than malignant neoplasm: Secondary | ICD-10-CM

## 2024-07-10 LAB — CBC WITH DIFFERENTIAL/PLATELET
Basophils Absolute: 0 K/uL (ref 0.0–0.1)
Basophils Relative: 0.6 % (ref 0.0–3.0)
Eosinophils Absolute: 0.2 K/uL (ref 0.0–0.7)
Eosinophils Relative: 2.7 % (ref 0.0–5.0)
HCT: 36.8 % (ref 36.0–46.0)
Hemoglobin: 12.1 g/dL (ref 12.0–15.0)
Lymphocytes Relative: 35.3 % (ref 12.0–46.0)
Lymphs Abs: 2.5 K/uL (ref 0.7–4.0)
MCHC: 32.8 g/dL (ref 30.0–36.0)
MCV: 105.1 fl — ABNORMAL HIGH (ref 78.0–100.0)
Monocytes Absolute: 0.6 K/uL (ref 0.1–1.0)
Monocytes Relative: 7.9 % (ref 3.0–12.0)
Neutro Abs: 3.7 K/uL (ref 1.4–7.7)
Neutrophils Relative %: 53.5 % (ref 43.0–77.0)
Platelets: 173 K/uL (ref 150.0–400.0)
RBC: 3.5 Mil/uL — ABNORMAL LOW (ref 3.87–5.11)
RDW: 17.2 % — ABNORMAL HIGH (ref 11.5–15.5)
WBC: 7 K/uL (ref 4.0–10.5)

## 2024-07-10 LAB — BRAIN NATRIURETIC PEPTIDE: Pro B Natriuretic peptide (BNP): 376 pg/mL — ABNORMAL HIGH (ref 1.0–100.0)

## 2024-07-10 MED ORDER — ALBUTEROL SULFATE HFA 108 (90 BASE) MCG/ACT IN AERS: 2.0000 | INHALATION_SPRAY | Freq: Four times a day (QID) | RESPIRATORY_TRACT | 6 refills | Status: AC | PRN

## 2024-07-10 MED ORDER — FLUTICASONE FUROATE-VILANTEROL 200-25 MCG/ACT IN AEPB: 1.0000 | INHALATION_SPRAY | Freq: Every day | RESPIRATORY_TRACT | 5 refills | Status: DC

## 2024-07-10 NOTE — Patient Instructions (Signed)
" °  VISIT SUMMARY: You were seen today for breathing difficulties related to your COPD. You were recently hospitalized and have been using home oxygen. You have quit smoking two weeks ago, which is a positive step for your health.  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 -Continue using Breo Ellipta  inhaler daily as prescribed. -Use albuterol  rescue inhaler as needed. -Rinse your mouth after using Breo Ellipta  to prevent thrush. -Continue using supplemental oxygen, especially at night. -You will have a pulmonary function test scheduled to assess your COPD. -You will be referred to a cardiologist for evaluation of coronary artery calcification. -An annual CT scan will be scheduled for lung cancer screening due to your smoking history.  CHRONIC RESPIRATORY FAILURE WITH HYPOXIA: You have chronic respiratory failure with low oxygen levels, especially during exertion. -Continue using supplemental oxygen, especially at night. -Monitor your oxygen saturation levels during exertion to ensure they stay above 88-90%.  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 -You will be referred to a cardiologist for further evaluation. -You may need a stress test or special scan to assess for blockages.  PERSONAL HISTORY OF NICOTINE DEPENDENCE: You have a history of smoking but have quit two weeks ago. -Continue your efforts to stay smoke-free. -Maintaining smoking cessation is important for managing your COPD and improving your overall health. - we will schedule a CT chest towards the end of this year  Contains text generated by [employer].   "

## 2024-07-10 NOTE — Progress Notes (Addendum)
 "  New Patient Pulmonology Office Visit   Subjective:  Patient ID: Melissa Odom, female    DOB: [birth year]  MRN: [id number]  Referred by: Maree Isles, MD  CC:  Chief Complaint  Patient presents with   Consult    Patient was referred for COPD, Pt states breathing is okay Pt 02 levels drops when pt gets up and start moving around  Recently stopped smoking 2 weeks ago    HPI Melissa Odom is a 74 y.o. female who is referred to her for COPD and post hospital discharge for copd exacerbation.   Discussed the use of AI scribe software for clinical note transcription with the patient, who gave verbal consent to proceed.  History of Present Illness The patient, with COPD, presents with breathing difficulties.  She was hospitalized on Christmas Day due to breathing difficulties, marking her first hospital admission for this issue. She had been feeling unwell and weak for about a week prior to the admission. During her hospital stay, she was treated with IV antibiotics, nausea medication, steroids and required oxygen therapy. She was discharged with home oxygen, which she uses intermittently, particularly when exerting herself, as her oxygen levels drop to 88%.  She has a history of COPD. She uses Breo Ellipta  inhaler once daily but does not use it consistently as prescribed. She also uses oxygen at home, provided by [employer].  She has a history of smoking, having quit two weeks ago after smoking a pack a day since the age of 69. She has a history of hypothyroidism, anxiety, and neuropathy in her legs. No high blood pressure. She received a pneumonia vaccine two to three years ago and a recent flu shot.  During her recent hospitalization, her respiratory oxygen levels were in the seventies, requiring three liters of oxygen. A CT scan showed no pulmonary embolism but did reveal dependent atelectasis and calcifications in her heart vessels. She completed a course of antibiotics at home  post-discharge.  She experiences shortness of breath upon exertion, such as walking or performing activities, and her oxygen levels drop during these times. She can walk around the house but needs to rest intermittently. No flu during her hospital stay, although it was initially suspected.     ROS Review of system positive for leg swelling dyspnea on exertion  anxiety  Allergies: Morphine and Sulfamethoxazole Current Medications[1] Past Medical History:  Diagnosis Date   COPD (chronic obstructive pulmonary disease) (HCC)    Tobacco abuse    History reviewed. No pertinent surgical history. History reviewed. No pertinent family history. Social History   Socioeconomic History   Marital status: Married    Spouse name: Not on file   Number of children: Not on file   Years of education: Not on file   Highest education level: Not on file  Occupational History   Not on file  Tobacco Use   Smoking status: Every Day    Current packs/day: 0.25    Average packs/day: 0.3 packs/day for 34.0 years (8.5 ttl pk-yrs)    Types: Cigarettes    Start date: 38   Smokeless tobacco: Never  Substance and Sexual Activity   Alcohol use: Never   Drug use: Never    Comment: previous opiate addiction   Sexual activity: Not on file  Other Topics Concern   Not on file  Social History Narrative   Not on file   Social Drivers of Health   Tobacco Use: High Risk (07/10/2024)   Patient History  Smoking Tobacco Use: Every Day    Smokeless Tobacco Use: Never    Passive Exposure: Not on file  Financial Resource Strain: Low Risk (06/20/2024)   Received from [facility]   Overall Financial Resource Strain (CARDIA)    How hard is it for you to pay for the very basics like food, housing, medical care, and heating?: Not hard at all  Food Insecurity: No Food Insecurity (06/20/2024)   Received from [facility]   Epic    Within the past 12 months, you worried that your food would run out  before you got the money to buy more.: Never true    Within the past 12 months, the food you bought just didn't last and you didn't have money to get more.: Never true  Transportation Needs: No Transportation Needs (06/20/2024)   Received from [facility] - Transportation    Lack of Transportation (Medical): No    Lack of Transportation (Non-Medical): No  Physical Activity: Sufficiently Active (06/20/2024)   Received from [facility]   Exercise Vital Sign    On average, how many days per week do you engage in moderate to strenuous exercise (like a brisk walk)?: 6 days    On average, how many minutes do you engage in exercise at this level?: 50 min  Stress: Stress Concern Present (06/20/2024)   Received from [facility] - Occupational Stress Questionnaire    Do you feel stress - tense, restless, nervous, or anxious, or unable to sleep at night because your mind is troubled all the time - these days?: To some extent  Social Connections: Moderately Isolated (06/20/2024)   Received from [facility]   Social Connection and Isolation Panel    In a typical week, how many times do you talk on the phone with family, friends, or neighbors?: Three times a week    How often do you get together with friends or relatives?: Three times a week    How often do you attend church or religious services?: Never    Do you belong to any clubs or organizations such as church groups, unions, fraternal or athletic groups, or school groups?: No    How often do you attend meetings of the clubs or organizations you belong to?: Never    Are you married, widowed, divorced, separated, never married, or living with a partner?: Married  Intimate Partner Violence: Not At Risk (06/20/2024)   Received from [facility]   Epic    Within the last year, have you been afraid of your partner or ex-partner?: No    Within the last year, have you been  humiliated or emotionally abused in other ways by your partner or ex-partner?: No    Within the last year, have you been kicked, hit, slapped, or otherwise physically hurt by your partner or ex-partner?: No    Within the last year, have you been raped or forced to have any kind of sexual activity by your partner or ex-partner?: No  Depression (PHQ2-9): Not on file  Alcohol Screen: Not on file  Housing: Not on file  Utilities: Low Risk (06/20/2024)   Received from [facility]   Utilities    Within the past 12 months, have you been unable to get utilities(heat, electricity) when it was really needed?: No  Health Literacy: Low Risk (06/20/2024)   Received from [facility]  Literacy    How often do you need to have someone help you when you read instructions, pamphlets, or other written material from your doctor or pharmacy?: Never         Objective:  BP 124/84   Pulse 82 Comment: LAP 3  Temp 97.9 F (36.6 C) (Oral)   Ht 5' 1 (1.549 m) Comment: per patient  Wt 132 lb 12.8 oz (60.2 kg)   SpO2 97% Comment: LAP 3 ON 2L OF CONTINUOUS 02  BMI 25.09 kg/m  SpO2 Readings from Last 3 Encounters:  07/10/24 97%  06/14/19 99%  08/28/18 94%    Physical Exam Constitutional:      General: She is not in acute distress.    Appearance: Normal appearance.     Comments: Pleasant   HENT:     Mouth/Throat:     Mouth: Mucous membranes are moist.  Cardiovascular:     Rate and Rhythm: Normal rate.  Pulmonary:     Effort: No respiratory distress.     Breath sounds: No wheezing or rales.  Musculoskeletal:     Right lower leg: No edema.     Left lower leg: No edema.  Skin:    General: Skin is warm.  Neurological:     Mental Status: She is alert and oriented to person, place, and time.  Psychiatric:        Mood and Affect: Mood normal.     Diagnostic Review:    Pft     No data to display               Results Labs Renal function (06/20/2024): Within normal  limits Hemoglobin (06/20/2024): Within normal limits Liver enzymes (06/20/2024): Within normal limits Troponin (06/20/2024): Within normal limits  REVIEWED d/c summary and notes from [facility] hospital  Radiology Chest CT (06/20/2024): as per reports from [facility]  No pulmonary embolism. Dependent atelectasis in lung bases. No consolidation. Coronary artery calcifications. Small Hiatal hernia. Mild diffuse interstitial prominence. Chest X-ray (06/20/2024):  Mild diffuse interstitial prominence. Chest CT (2020): Normal      Assessment & Plan:   Assessment & Plan Chronic obstructive pulmonary disease with emphysema, unspecified emphysema type (HCC) Discussed the symptoms, etiology, pathophysiology, diagnostic test, treatment, flare ups,  prognosis of copd Continue breo once a day Encouraged compliance I advised pt to rinse mouth after inhaler use Use albuterol  as needed Orders:   Pulmonary function test; Future   CBC with Differential   IgE; Future  Former smoker Congratulated on quitting smoking 2 weeks ago Orders:   Pulmonary function test; Future   CBC with Differential   IgE; Future  Hospital discharge follow-up Almost back to baseline Completed antibiotics and steroid Prescribed albuterol  for as needed use Orders:   Pulmonary function test; Future   CBC with Differential   IgE; Future  Coronary artery calcification Patient has evidence of coronary artery calcification and has reports exertional dyspnea Orders:   Ambulatory referral to Cardiology  Chronic respiratory failure with hypoxia (HCC) Patient desaturated on walk test in the clinic today- down to 87%, was placed on oxygen at 3 L.  Needs to continue to use oxygen with exertion and at night while sleeping. Resting oxygen saturation was 91%    Pedal edema .  Low-salt diet Orders:   Ambulatory referral to Cardiology   B Nat Peptide; Future    Thank you for the opportunity to take part in the care of RUQAYYAH LUTE   Return in about  3 months (around 10/08/2024).   Samaiya Awadallah Pleas, MD [facility] Office: [phone]     [1]  Current Outpatient Medications:    albuterol  (VENTOLIN  HFA) 108 (90 Base) MCG/ACT inhaler, Inhale 2 puffs into the lungs every 6 (six) hours as needed for wheezing or shortness of breath., Disp: 8 g, Rfl: 6   atenolol  (TENORMIN ) 50 MG tablet, Take 50 mg by mouth daily. , Disp: , Rfl:    busPIRone  (BUSPAR ) 15 MG tablet, Take 15 mg by mouth 3 (three) times daily., Disp: , Rfl:    clonazePAM  (KLONOPIN ) 1 MG tablet, Take 1 mg by mouth 3 (three) times daily as needed for anxiety. , Disp: , Rfl:    fluticasone  furoate-vilanterol (BREO ELLIPTA ) 200-25 MCG/ACT AEPB, Inhale 1 puff into the lungs daily., Disp: 1 each, Rfl: 5   gabapentin  (NEURONTIN ) 600 MG tablet, Take 1 tablet (600 mg total) by mouth 3 (three) times daily., Disp: , Rfl:    levothyroxine  (SYNTHROID , LEVOTHROID) 88 MCG tablet, Take 88 mcg by mouth daily., Disp: , Rfl:    sertraline  (ZOLOFT ) 100 MG tablet, Take 100 mg by mouth daily., Disp: , Rfl:    traMADol  (ULTRAM ) 50 MG tablet, Take 50 mg by mouth 2 (two) times daily., Disp: , Rfl:    Vitamin D, Ergocalciferol, (DRISDOL) 1.25 MG (50000 UT) CAPS capsule, Take 50,000 Units by mouth every 7 (seven) days. , Disp: , Rfl:   "

## 2024-07-10 NOTE — Assessment & Plan Note (Addendum)
 Discussed the symptoms, etiology, pathophysiology, diagnostic test, treatment, flare ups,  prognosis of copd Continue breo once a day Encouraged compliance I advised pt to rinse mouth after inhaler use Use albuterol  as needed Orders:   Pulmonary function test; Future   CBC with Differential   IgE; Future

## 2024-07-16 LAB — IGE: IgE (Immunoglobulin E), Serum: 338 kU/L — ABNORMAL HIGH

## 2024-07-18 ENCOUNTER — Ambulatory Visit: Attending: Cardiology | Admitting: Internal Medicine

## 2024-07-18 ENCOUNTER — Encounter: Payer: Self-pay | Admitting: Internal Medicine

## 2024-07-18 VITALS — BP 134/86 | HR 81 | Ht 61.0 in | Wt 134.8 lb

## 2024-07-18 DIAGNOSIS — R600 Localized edema: Secondary | ICD-10-CM | POA: Diagnosis not present

## 2024-07-18 DIAGNOSIS — J449 Chronic obstructive pulmonary disease, unspecified: Secondary | ICD-10-CM | POA: Diagnosis not present

## 2024-07-18 DIAGNOSIS — I2510 Atherosclerotic heart disease of native coronary artery without angina pectoris: Secondary | ICD-10-CM | POA: Diagnosis not present

## 2024-07-18 DIAGNOSIS — J9601 Acute respiratory failure with hypoxia: Secondary | ICD-10-CM

## 2024-07-18 LAB — LIPID PANEL
Chol/HDL Ratio: 2.6 ratio (ref 0.0–4.4)
Cholesterol, Total: 176 mg/dL (ref 100–199)
HDL: 67 mg/dL
LDL Chol Calc (NIH): 90 mg/dL (ref 0–99)
Triglycerides: 108 mg/dL (ref 0–149)
VLDL Cholesterol Cal: 19 mg/dL (ref 5–40)

## 2024-07-18 MED ORDER — METOPROLOL SUCCINATE ER 25 MG PO TB24: 25.0000 mg | ORAL_TABLET | Freq: Two times a day (BID) | ORAL | 2 refills | Status: AC

## 2024-07-18 NOTE — Progress Notes (Signed)
 " Cardiology Office Note:  .   Date:  07/18/2024  ID:  Melissa Odom, DOB 1950/12/05, MRN [id number] PCP: Maree Isles, MD  [facility] Providers Cardiologist:  None    History of Present Illness: .     Discussed the use of AI scribe software for clinical note transcription with the patient, who gave verbal consent to proceed.  History of Present Illness Melissa Odom is a 74 year old female with COPD and emphysema who presents with pedal edema and coronary calcifications. She was referred by her PCP due to concerns of pedal edema and coronary calcifications noted on a CT.  Dyspnea and oxygen dependence - Shortness of breath, particularly with exertion such as shopping at the mall - Severity of dyspnea causes sensation of impending collapse - Home oxygen therapy in use; carries portable oxygen when leaving home - Oxygen saturation decreases with increased activity  Lower extremity edema - Pedal edema observed by primary care provider - Swelling in legs is intermittent and occasionally pronounced - Hydrochlorothiazide taken as needed, which reduces swelling  Coronary calcifications - Coronary calcifications identified on CT scan - No chest pain or significant symptoms during ambulation - Occasional shortness of breath with walking  Hypertension and antihypertensive therapy - History of hypertension - Currently taking atenolol  50 mg, uncertain indication - Recent episodes of low blood pressure  Tobacco use - Abrupt cessation of smoking three weeks ago - History of tobacco use associated with underlying pulmonary disease          ROS: Remaining review of systems negative  Studies Reviewed: SABRA   EKG Interpretation Date/Time:  Thursday July 18 2024 15:00:05 EST Ventricular Rate:  81 PR Interval:  158 QRS Duration:  76 QT Interval:  372 QTC Calculation: 432 R Axis:   -7  Text Interpretation: Sinus rhythm with occasional Premature ventricular complexes  and Premature atrial complexes Low voltage QRS When compared with ECG of 14-Jun-2019 15:27, Premature ventricular complexes are now Present Premature atrial complexes are now Present T wave inversion no longer evident in Anterior leads Confirmed by Kriste Hicks [id number]) on 07/18/2024 3:01:41 PM    Results Labs proBNP (07/10/2024): 376, elevated IgE: Elevated  Radiology CT chest: Coronary artery calcifications and atherosclerotic plaque  Diagnostic EKG: Premature atrial contractions and premature ventricular contractions Risk Assessment/Calculations:             Physical Exam:   VS:  BP 134/86 (BP Location: Left Arm)   Pulse 81   Ht 5' 1 (1.549 m)   Wt 134 lb 12.8 oz (61.1 kg)   SpO2 93%   BMI 25.47 kg/m    Wt Readings from Last 3 Encounters:  07/18/24 134 lb 12.8 oz (61.1 kg)  07/10/24 132 lb 12.8 oz (60.2 kg)  06/14/19 126 lb (57.2 kg)    GEN: Well nourished, well developed in no acute distress NECK: No JVD; No carotid bruits CARDIAC:  RRR with extrasystoles, no murmurs, no rubs, no gallops RESPIRATORY:  Clear to auscultation without rales, wheezing or rhonchi  ABDOMEN: Soft, non-tender, non-distended EXTREMITIES:  No edema; No deformity   ASSESSMENT AND PLAN: .    Assessment and Plan Assessment & Plan Atherosclerotic heart disease of native coronary artery Coronary calcifications on CT indicate plaque. Aspirin allergy limits antiplatelet options. - Ordered CCTA to assess coronary arteries and plaque score. - Checked lipid panel. - Echocardiogram - Switched atenolol  to metoprolol  25 mg twice daily.  Intermittent edema, currently euvolemic Intermittent edema with previous  significant swelling. Hydrochlorothiazide prescribed by her PCP provides some improvement.  proBNP was elevated most recently. - If she has recurrent issues then would use Lasix rather than hydrochlorothiazide - Echocardiogram  Cardiac arrhythmia EKG shows PACs and PVCs.  - Switched atenolol  to  metoprolol  25 mg twice daily.  Chronic hypoxic respiratory failure with COPD Uses as needed oxygen.  Follows with pulmonology              Follow up: 3 months  Signed, Emeline FORBES Calender, DO  07/18/2024 3:27 PM    [facility] "

## 2024-07-18 NOTE — Patient Instructions (Addendum)
 Medication Instructions:   START TAKING:   METOPROLOL    SUCCINATE    25 MG  TWICE   A  DAY     STOP TAKING AND REMOVE THIS MEDICATION FROM YOUR MEDICATION LIST:  ATENOLOL      *If you need a refill on your cardiac medications before your next appointment, please call your pharmacy*   Lab Work   PLEASE GO DOWN STAIRS  [facility]  FIRST FLOOR   ( GET OFF ELEVATORS WALK TOWARDS WAITING AREA LAB LOCATED BY PHARMACY):  LIPID    If you have labs (blood work) drawn today and your tests are completely normal, you will receive your results only by: MyChart Message (if you have MyChart) OR A paper copy in the mail If you have any lab test that is abnormal or we need to change your treatment, we will call you to review the results.  Testing/Procedures: Your physician has requested that you have an echocardiogram. Echocardiography is a painless test that uses sound waves to create images of your heart. It provides your doctor with information about the size and shape of your heart and how well your hearts chambers and valves are working. This procedure takes approximately one hour. There are no restrictions for this procedure. Please do NOT wear cologne, perfume, aftershave, or lotions (deodorant is allowed). Please arrive 15 minutes prior to your appointment time.  Please note: We ask at that you not bring children with you during ultrasound (echo/ vascular) testing. Due to room size and safety concerns, children are not allowed in the ultrasound rooms during exams. Our front office staff cannot provide observation of children in our lobby area while testing is being conducted. An adult accompanying a patient to their appointment will only be allowed in the ultrasound room at the discretion of the ultrasound technician under special circumstances. We apologize for any inconvenience.   Non-Cardiac CT Angiography (CTA), is a special type of CT scan that uses a computer to produce multi-dimensional views of  major blood vessels throughout the body. In CT angiography, a contrast material is injected through an IV to help visualize the blood vessels   Follow-Up: At [facility], you and your health needs are our priority.  As part of our continuing mission to provide you with exceptional heart care, our providers are all part of one team.  This team includes your primary Cardiologist (physician) and Advanced Practice Providers or APPs (Physician Assistants and Nurse Practitioners) who all work together to provide you with the care you need, when you need it.  Your next appointment:  3 month(s)   Provider: Dr. Kriste      We recommend signing up for the patient portal called MyChart.  Sign up information is provided on this After Visit Summary.  MyChart is used to connect with patients for Virtual Visits (Telemedicine).  Patients are able to view lab/test results, encounter notes, upcoming appointments, etc.  Non-urgent messages can be sent to your provider as well.   To learn more about what you can do with MyChart, go to [email or web address].   Other Instructions    Your cardiac CT will be scheduled at one of the below locations:   [facility] [address] [place], [place] [address] [phone] (Severe contrast allergies only)  OR   [facility] [address] [place], [place] [address] [phone]  OR   [facility] [address] [place], [place] [address] [phone]  OR   [facility]. [facility] and Vascular Tower [address]  [place], [place] [address]  OR   [facility] [address] [place], [place] [phone]  If scheduled at [facility], please arrive at the [facility] and Children's Entrance (Entrance C2) of [facility] 30 minutes prior to test start time. You can use the FREE valet parking offered at entrance C (encouraged to control the heart rate for the test)   Proceed to the [facility] Radiology Department (first floor) to check-in and test prep.  All radiology patients and guests should use entrance C2 at [facility], accessed from [facility], even though the hospital's physical address listed is [address]  If scheduled at the Heart and Vascular Tower at [address], please enter the parking lot using the [address] entrance and use the FREE valet service at the patient drop-off area. Enter the building and check-in with registration on the main floor.  If scheduled at [facility], please arrive to the Heart and Vascular Center 15 mins early for check-in and test prep.  There is spacious parking and easy access to the radiology department from the [facility] Heart and Vascular entrance. Please enter here and check-in with the desk attendant.   If scheduled at [facility], please arrive 30 minutes early for check-in and test prep.  Please follow these instructions carefully (unless otherwise directed):  An IV will be required for this test and Nitroglycerin will be given.    On the Night Before the Test:  Be sure to Drink plenty of water. Do not consume any caffeinated/decaffeinated beverages or chocolate 12 hours prior to your test. Do not take any antihistamines 12 hours prior to your test.   On the Day of the Test: Drink plenty of water until 1 hour prior to the test. Do not eat any food 1 hour prior to test. You may take your regular medications prior to the test.  Take metoprolol  (Lopressor ) two hours prior to test. If you take Furosemide/Hydrochlorothiazide/Spironolactone/Chlorthalidone, please HOLD on the morning of the test. Patients who wear a continuous glucose monitor MUST remove the device prior to scanning. FEMALES- please wear underwire-free bra if available, avoid dresses & tight clothing       After the Test: Drink plenty of water. After receiving IV  contrast, you may experience a mild flushed feeling. This is normal. On occasion, you may experience a mild rash up to 24 hours after the test. This is not dangerous. If this occurs, you can take Benadryl 25 mg, Zyrtec, Claritin, or Allegra and increase your fluid intake. (Patients taking Tikosyn should avoid Benadryl, and may take Zyrtec, Claritin, or Allegra) If you experience trouble breathing, this can be serious. If it is severe call 911 IMMEDIATELY. If it is mild, please call our office.  We will call to schedule your test 2-4 weeks out understanding that some insurance companies will need an authorization prior to the service being performed.   For more information and frequently asked questions, please visit our website : [email or web address]  For non-scheduling related questions, please contact the cardiac imaging nurse navigator should you have any questions/concerns: Cardiac Imaging Nurse Navigators Direct Office Dial: [phone]   For scheduling needs, including cancellations and rescheduling, please call Brittany, [phone].

## 2024-07-23 ENCOUNTER — Ambulatory Visit: Payer: Self-pay | Admitting: Internal Medicine

## 2024-07-30 MED ORDER — ROSUVASTATIN CALCIUM 10 MG PO TABS: 10.0000 mg | ORAL_TABLET | Freq: Every day | ORAL | 3 refills | Status: AC

## 2024-08-15 ENCOUNTER — Ambulatory Visit (HOSPITAL_COMMUNITY)

## 2024-08-23 ENCOUNTER — Ambulatory Visit (HOSPITAL_COMMUNITY)

## 2024-10-08 ENCOUNTER — Encounter

## 2024-10-09 ENCOUNTER — Encounter

## 2024-10-09 ENCOUNTER — Ambulatory Visit

## 2024-10-23 ENCOUNTER — Ambulatory Visit: Admitting: Internal Medicine
